# Patient Record
Sex: Male | Born: 1935 | Race: Black or African American | Hispanic: No | Smoking: Former smoker
Health system: Southern US, Community
[De-identification: ages and names within clinical notes are randomized; demographics above are authoritative.]

## PROBLEM LIST (undated history)

## (undated) DIAGNOSIS — M1990 Unspecified osteoarthritis, unspecified site: Secondary | ICD-10-CM

## (undated) HISTORY — PX: HERNIA REPAIR: SHX51

## (undated) HISTORY — DX: Unspecified osteoarthritis, unspecified site: M19.90

---

## 1998-12-29 ENCOUNTER — Encounter: Admission: RE | Admit: 1998-12-29 | Discharge: 1998-12-29 | Payer: Self-pay | Admitting: Hematology and Oncology

## 2005-05-31 ENCOUNTER — Emergency Department (HOSPITAL_COMMUNITY): Admission: EM | Admit: 2005-05-31 | Discharge: 2005-05-31 | Payer: Self-pay | Admitting: Emergency Medicine

## 2005-06-13 ENCOUNTER — Emergency Department (HOSPITAL_COMMUNITY): Admission: EM | Admit: 2005-06-13 | Discharge: 2005-06-13 | Payer: Self-pay | Admitting: Family Medicine

## 2010-09-28 ENCOUNTER — Emergency Department (HOSPITAL_COMMUNITY)
Admission: EM | Admit: 2010-09-28 | Discharge: 2010-09-29 | Payer: Self-pay | Source: Home / Self Care | Admitting: Emergency Medicine

## 2010-11-16 ENCOUNTER — Encounter (HOSPITAL_COMMUNITY): Payer: PRIVATE HEALTH INSURANCE | Attending: Urology

## 2010-11-16 DIAGNOSIS — N400 Enlarged prostate without lower urinary tract symptoms: Secondary | ICD-10-CM | POA: Insufficient documentation

## 2010-11-16 DIAGNOSIS — Z01818 Encounter for other preprocedural examination: Secondary | ICD-10-CM | POA: Insufficient documentation

## 2010-11-16 DIAGNOSIS — R339 Retention of urine, unspecified: Secondary | ICD-10-CM | POA: Insufficient documentation

## 2010-11-16 LAB — COMPREHENSIVE METABOLIC PANEL
AST: 21 U/L (ref 0–37)
CO2: 29 mEq/L (ref 19–32)
Creatinine, Ser: 0.84 mg/dL (ref 0.4–1.5)
Potassium: 4.1 mEq/L (ref 3.5–5.1)
Sodium: 142 mEq/L (ref 135–145)
Total Bilirubin: 0.9 mg/dL (ref 0.3–1.2)
Total Protein: 7.6 g/dL (ref 6.0–8.3)

## 2010-11-16 LAB — PROTIME-INR: INR: 1.07 (ref 0.00–1.49)

## 2010-11-16 LAB — URINALYSIS, ROUTINE W REFLEX MICROSCOPIC
Nitrite: POSITIVE — AB
Urobilinogen, UA: 0.2 mg/dL (ref 0.0–1.0)

## 2010-11-16 LAB — SURGICAL PCR SCREEN: MRSA, PCR: NEGATIVE

## 2010-11-16 LAB — CBC
Hemoglobin: 14.9 g/dL (ref 13.0–17.0)
MCHC: 35 g/dL (ref 30.0–36.0)
MCV: 86.6 fL (ref 78.0–100.0)
Platelets: 238 10*3/uL (ref 150–400)
RBC: 4.92 MIL/uL (ref 4.22–5.81)

## 2010-11-16 LAB — URINE MICROSCOPIC-ADD ON

## 2010-11-18 LAB — URINE CULTURE
Colony Count: >=100000
Culture  Setup Time: 201202081432

## 2010-11-25 ENCOUNTER — Inpatient Hospital Stay (HOSPITAL_COMMUNITY)
Admission: RE | Admit: 2010-11-25 | Discharge: 2010-12-01 | DRG: 707 | Disposition: A | Discharge status: Home / Self Care | Payer: Medicare (Managed Care) | Source: Ambulatory Visit | Attending: Urology | Admitting: Urology

## 2010-11-25 ENCOUNTER — Other Ambulatory Visit: Payer: Self-pay | Admitting: Urology

## 2010-11-25 DIAGNOSIS — Y838 Other surgical procedures as the cause of abnormal reaction of the patient, or of later complication, without mention of misadventure at the time of the procedure: Secondary | ICD-10-CM | POA: Diagnosis not present

## 2010-11-25 DIAGNOSIS — N138 Other obstructive and reflux uropathy: Principal | ICD-10-CM | POA: Diagnosis present

## 2010-11-25 DIAGNOSIS — J449 Chronic obstructive pulmonary disease, unspecified: Secondary | ICD-10-CM | POA: Diagnosis present

## 2010-11-25 DIAGNOSIS — R339 Retention of urine, unspecified: Secondary | ICD-10-CM | POA: Diagnosis present

## 2010-11-25 DIAGNOSIS — IMO0002 Reserved for concepts with insufficient information to code with codable children: Secondary | ICD-10-CM | POA: Diagnosis not present

## 2010-11-25 DIAGNOSIS — K644 Residual hemorrhoidal skin tags: Secondary | ICD-10-CM | POA: Diagnosis present

## 2010-11-25 DIAGNOSIS — R112 Nausea with vomiting, unspecified: Secondary | ICD-10-CM | POA: Diagnosis not present

## 2010-11-25 DIAGNOSIS — K5900 Constipation, unspecified: Secondary | ICD-10-CM | POA: Diagnosis not present

## 2010-11-25 DIAGNOSIS — J4489 Other specified chronic obstructive pulmonary disease: Secondary | ICD-10-CM | POA: Diagnosis present

## 2010-11-25 DIAGNOSIS — N3289 Other specified disorders of bladder: Secondary | ICD-10-CM | POA: Diagnosis not present

## 2010-11-25 DIAGNOSIS — N401 Enlarged prostate with lower urinary tract symptoms: Principal | ICD-10-CM | POA: Diagnosis present

## 2010-11-25 LAB — BASIC METABOLIC PANEL
BUN: 8 mg/dL (ref 6–23)
BUN: 8 mg/dL (ref 6–23)
CO2: 27 mEq/L (ref 19–32)
CO2: 29 mEq/L (ref 19–32)
Calcium: 8.2 mg/dL — ABNORMAL LOW (ref 8.4–10.5)
Calcium: 8.1 mg/dL — ABNORMAL LOW (ref 8.4–10.5)
Creatinine, Ser: 0.78 mg/dL (ref 0.4–1.5)
GFR calc Af Amer: >60 mL/min (ref >60)
GFR calc non Af Amer: >60 mL/min (ref >60)
GFR calc non Af Amer: >60 mL/min (ref >60)
Glucose, Bld: 140 mg/dL — ABNORMAL HIGH (ref 70–99)
Glucose, Bld: 165 mg/dL — ABNORMAL HIGH (ref 70–99)

## 2010-11-25 LAB — CBC
Hemoglobin: 11.4 g/dL — ABNORMAL LOW (ref 13.0–17.0)
Hemoglobin: 11.2 g/dL — ABNORMAL LOW (ref 13.0–17.0)
MCH: 30.3 pg (ref 26.0–34.0)
MCV: 87.5 fL (ref 78.0–100.0)
Platelets: 150 10*3/uL (ref 150–400)
Platelets: 152 10*3/uL (ref 150–400)
WBC: 15.4 10*3/uL — ABNORMAL HIGH (ref 4.0–10.5)

## 2010-11-26 LAB — CBC
HCT: 31.4 % — ABNORMAL LOW (ref 39.0–52.0)
MCHC: 34.4 g/dL (ref 30.0–36.0)
MCV: 86.3 fL (ref 78.0–100.0)
Platelets: 155 10*3/uL (ref 150–400)
RBC: 3.64 MIL/uL — ABNORMAL LOW (ref 4.22–5.81)
WBC: 13.5 10*3/uL — ABNORMAL HIGH (ref 4.0–10.5)

## 2010-11-26 LAB — BASIC METABOLIC PANEL
CO2: 28 mEq/L (ref 19–32)
GFR calc non Af Amer: >60 mL/min (ref >60)
Potassium: 3.9 mEq/L (ref 3.5–5.1)

## 2010-11-26 LAB — PREPARE FRESH FROZEN PLASMA: Unit division: 0

## 2010-11-27 LAB — CBC
HCT: 31.1 % — ABNORMAL LOW (ref 39.0–52.0)
Hemoglobin: 10.5 g/dL — ABNORMAL LOW (ref 13.0–17.0)
MCH: 30 pg (ref 26.0–34.0)
MCV: 88.9 fL (ref 78.0–100.0)
RBC: 3.5 MIL/uL — ABNORMAL LOW (ref 4.22–5.81)

## 2010-11-27 LAB — BASIC METABOLIC PANEL
BUN: 6 mg/dL (ref 6–23)
Chloride: 103 mEq/L (ref 96–112)
Creatinine, Ser: 1.01 mg/dL (ref 0.4–1.5)
GFR calc Af Amer: >60 mL/min (ref >60)
Sodium: 138 mEq/L (ref 135–145)

## 2010-11-28 LAB — TYPE AND SCREEN
Unit division: 0
Unit division: 0

## 2010-11-28 LAB — POCT I-STAT 4, (NA,K, GLUC, HGB,HCT): HCT: 27 % — ABNORMAL LOW (ref 39.0–52.0)

## 2010-11-29 LAB — CBC
Hemoglobin: 9.9 g/dL — ABNORMAL LOW (ref 13.0–17.0)
MCH: 29.5 pg (ref 26.0–34.0)
MCHC: 33.4 g/dL (ref 30.0–36.0)
RDW: 13.1 % (ref 11.5–15.5)

## 2010-11-29 LAB — BASIC METABOLIC PANEL
BUN: 6 mg/dL (ref 6–23)
Calcium: 8.6 mg/dL (ref 8.4–10.5)
GFR calc non Af Amer: >60 mL/min (ref >60)
Potassium: 3.9 mEq/L (ref 3.5–5.1)

## 2010-12-19 LAB — DIFFERENTIAL
Lymphocytes Relative: 4 % — ABNORMAL LOW (ref 12–46)
Lymphs Abs: 0.5 10*3/uL — ABNORMAL LOW (ref 0.7–4.0)
Monocytes Absolute: 1.3 10*3/uL — ABNORMAL HIGH (ref 0.1–1.0)
Monocytes Relative: 11 % (ref 3–12)
Neutro Abs: 10.9 10*3/uL — ABNORMAL HIGH (ref 1.7–7.7)
Neutrophils Relative %: 86 % — ABNORMAL HIGH (ref 43–77)

## 2010-12-19 LAB — GLUCOSE, CAPILLARY: Glucose-Capillary: 133 mg/dL — ABNORMAL HIGH (ref 70–99)

## 2010-12-19 LAB — COMPREHENSIVE METABOLIC PANEL
Albumin: 4.5 g/dL (ref 3.5–5.2)
BUN: 18 mg/dL (ref 6–23)
Calcium: 10 mg/dL (ref 8.4–10.5)
Creatinine, Ser: 1.27 mg/dL (ref 0.4–1.5)
Potassium: 3.8 mEq/L (ref 3.5–5.1)
Total Protein: 8.4 g/dL — ABNORMAL HIGH (ref 6.0–8.3)

## 2010-12-19 LAB — POCT CARDIAC MARKERS
CKMB, poc: <1 ng/mL — ABNORMAL LOW (ref 1.0–8.0)
Myoglobin, poc: 179 ng/mL (ref 12–200)
Troponin i, poc: <0.05 ng/mL (ref 0.00–0.09)

## 2010-12-19 LAB — URINALYSIS, ROUTINE W REFLEX MICROSCOPIC
Ketones, ur: NEGATIVE mg/dL
Protein, ur: 30 mg/dL — AB
Urobilinogen, UA: 0.2 mg/dL (ref 0.0–1.0)

## 2010-12-19 LAB — URINE MICROSCOPIC-ADD ON

## 2010-12-19 LAB — CBC
MCH: 30.7 pg (ref 26.0–34.0)
MCHC: 35.1 g/dL (ref 30.0–36.0)
MCV: 87.4 fL (ref 78.0–100.0)
Platelets: 203 10*3/uL (ref 150–400)
RDW: 13.2 % (ref 11.5–15.5)
WBC: 12.7 10*3/uL — ABNORMAL HIGH (ref 4.0–10.5)

## 2010-12-22 NOTE — Discharge Summary (Signed)
  NAME:  Cameron Barton, Cameron Barton                ACCOUNT NO.:  [id number]  MEDICAL RECORD NO.:  [id number]           PATIENT TYPE:  I  LOCATION:  1431                         FACILITY:  [facility]  PHYSICIAN:  Martina Sinner, MD DATE OF BIRTH:  1929-07-19  DATE OF ADMISSION:  11/25/2010 DATE OF DISCHARGE:  12/01/2010                              DISCHARGE SUMMARY   ADMISSION DIAGNOSIS:  Urinary retention from benign prostatic hyperplasia; status post open prostatectomy.  Cameron Barton has retention from BPH.  He underwent a suprapubic prostatectomy by myself and Dr. Vernie Ammons.  He did very well postoperatively.  He was maintained with a suprapubic catheter and Foley catheter.  He is elderly and we had added concerns about bleeding and constipation and blood per rectum while he was in the hospital.  He was observed a little bit longer since he had a few intermittent clots requiring ongoing Foley care.  He was afebrile postprocedure.  There was a lot of bleeding at the time of surgery.  He required 3 units of blood.  On February 21, his hemoglobin was 9.9, creatinine was 0.96, and electrolytes were normal.  He was afebrile throughout his course.  He was on antibiotics.  On the day of discharge, he was having irregular bowel movements.  He had been given some stool softeners as well as Dulcolax and Fleet enema. His incision looked good.  His Foley catheter was removed.  He went home with the suprapubic tube with instructions.  He went home with ciprofloxacin, stool softeners, and pain medication.  My nurse will call him tomorrow.  I will see him on Monday and I will take out the skin staples and give him a trial of voiding.  Overall, he did very well and I had done a digital rectal examination and he had external hemorrhoids that were quite impressive and no blood per rectum internally.          ______________________________ Martina Sinner, MD     SAM/MEDQ  D:  12/01/2010  T:   12/01/2010  Job:  [id number]  Electronically Signed by Alfredo Martinez MD on 12/22/2010 12:49:00 PM

## 2010-12-22 NOTE — Op Note (Signed)
NAME:  Cameron Barton, Cameron Barton                ACCOUNT NO.:  [id number]  MEDICAL RECORD NO.:  [id number]           PATIENT TYPE:  I  LOCATION:  0007                         FACILITY:  [facility]  PHYSICIAN:  Martina Sinner, MD DATE OF BIRTH:  [birth year]  DATE OF PROCEDURE:  11/25/2010 DATE OF DISCHARGE:                              OPERATIVE REPORT   PREOPERATIVE DIAGNOSIS:  Benign prostatic hyperplasia with retention.  POSTOPERATIVE DIAGNOSIS:  Benign prostatic hyperplasia with retention.  SURGERY:  Suprapubic prostatectomy, plus insertion of suprapubic catheter.  SURGEON:  Martina Sinner, MD.  ASSISTANTLoraine Leriche C. Vernie Ammons, M.D.  DESCRIPTION OF PROCEDURE:  Cameron Barton has a 200 g prostate.  He is in retention.  His urine was sterile prior to surgery and his lab work was normal.  He was placed in the supine position and in mild lithotomy.  A low midline suprapubic incision was made extending just below the umbilicus to the palpable suprapubic bone.  After prepping the patient, a sterile 24-French two-way catheter was inserted in the bladder.  I dissected down to the soft tissue and split the rectus muscle in the midline.  We entered the retropubic space.  Stay sutures were placed on the thickened bladder which was opened for approximately 8 to 10 cm.  I extended the cystotomy to approximately 3 cm from the bladder neck.  A Bookwalter retractor was utilized.  Middle blade was used in the bladder with 3 to 4 moist Ray-Tec stick to put the bladder on stretch.  Stay sutures were utilized as well.  There was excellent exposure of the large adenoma with minimal intravesical component.  With cautery, I outlined a circle around the prostatic urethra and bladder neck.  I placed my finger in the prostatic urethra placing it anteriorly, splitting the adenoma in the midline to the prostatic capsule and it took approximately 10 minutes to finger dissect and mobilize the large prostatic adenoma from  the thickened prostatic capsule.  It was difficult because it was so deep to get to the apex.  The adenoma was removed in its entirety.  Blood loss was reasonable that part of the dissection.  The adenoma was removed completely.  At this stage, we packed the prostatic fossa with moist packs for a few minutes.  With excellent exposure, we removed the packs and started oversewing bleeders first at the 5 and 7 o'clock incorporating the bladder mucosa into our closure.  I used SH needle, CT-1 needle and larger rounder needles with multiple suture ligatures to control the bleeding.  For approximately an hour we continued to tie bleeders primarily at 5 and 7 o'clock, at the bladder neck in its posterior half especially near the 5 and 7 o'clock and also apically which was more difficult.  We used a Foley catheter to make certain that we did not tie off the urethra and we were working apically.  There was no perforation of the thick prostatic capsule.  We tried to use the Foley balloon, blown up a few times in the prostatic fossa to stop bleeding but it really did not make a lot of  difference.  In a very controlled way, we continued to work until finally the bleeding was very minimal.  There were excellent blue jets from the ureteral orifices at all times.  There was no injury to the bladder.  Recognizing we could be partially occluding the ureters, we kept blown up Foley balloon with 50 cc in it on the bladder neck for probably 40 minutes while we finished the case.  We closed the bladder with running 2-0 Vicryl, 2 layers.  It was watertight.  Using an Allis and a small scalpel incision and a Kelly clamp, we delivered a 22-French two-way Foley catheter through the lower left abdomen causing no bleeding.  With the usual technique, it was placed into the bladder through a separate stab incision of the suprapubic tube.  A 2-0 chromic pursestring suture was utilized.  On the opposite side  using the same technique, we brought in a large round Jackson-Pratt drain.  The 2-0 silks were used for both the drain and suprapubic tube, 10 cc was in the suprapubic balloon, 50 cc was in the urethral catheter. Copious irrigation was used.  I closed the fascia with running #1 PDS on a large needle.  Skin staples were used for the skin.  Pressure dressing was applied.  Without the patient on traction, the catheter irrigated very nicely and suprapubic through the urethral catheter.  There was mild bleeding.  I set up slow flow CBI going through the suprapubic and exiting out the urethral Foley.  Velcro leg strap was applied.  A 3 units of blood and 2 units of fresh frozen plasma was given to the patient.  After 2 units of blood, the patient's hemoglobin, I believe, was 9.7.  Hemodynamically he did beautifully.  The patient will be continued to be monitored postoperatively. Hopefully, he will reach his treatment goal.          ______________________________ Martina Sinner, MD     SAM/MEDQ  D:  11/25/2010  T:  11/25/2010  Job:  [id number]  Electronically Signed by Alfredo Martinez MD on 12/22/2010 12:49:03 PM

## 2010-12-22 NOTE — H&P (Signed)
  NAME:  Cameron Barton, Cameron Barton                ACCOUNT NO.:  [id number]  MEDICAL RECORD NO.:  [id number]           PATIENT TYPE:  I  LOCATION:  1431                         FACILITY:  [facility]  PHYSICIAN:  Martina Sinner, MD DATE OF BIRTH:  16-Dec-1928  DATE OF ADMISSION:  11/25/2010 DATE OF DISCHARGE:                             HISTORY & PHYSICAL   ADMISSION DIAGNOSIS:  Retention with benign prostatic hyperplasia.  HISTORY:  Mr. Skolnick has retention and has a 200-g prostate.  He had an open prostatectomy today was admitted from usual postoperative care.  PAST HEALTH:  Arthritis, hernia repair, kidney surgery.  MEDICATIONS: 1. Aleve. 2. Vicodin.  ALLERGIES:  None.  FAMILY HISTORY:  No history of GU disease.  SOCIAL HISTORY:  Works Designer, multimedia.  REVIEW OF SYSTEMS:  Negative.  PHYSICAL EXAMINATION:  VITAL SIGNS:  Normal.  No distress. CARDIOVASCULAR:  Skin warm. RESPIRATORY:  Breaths quiet. ABDOMEN:  No palpable bladder. GU EXAM:  Foley catheter. GENITALIA:  Normal. MUSCULOSKELETAL:  Normal arm motor strength. SKIN:  No rashes. NEURO:  Normal sensation to touch.  IMPRESSION:  Retention from benign prostatic hyperplasia.  PLAN:  Admit post open prostatectomy.          ______________________________ Martina Sinner, MD     SAM/MEDQ  D:  11/25/2010  T:  11/26/2010  Job:  [id number]  Electronically Signed by Alfredo Martinez MD on 12/22/2010 12:49:04 PM

## 2015-08-01 ENCOUNTER — Ambulatory Visit (INDEPENDENT_AMBULATORY_CARE_PROVIDER_SITE_OTHER): Payer: Commercial Managed Care - HMO | Admitting: Physician Assistant

## 2015-08-01 VITALS — BP 142/78 | HR 84 | Temp 98.5°F | Resp 18 | Ht 68.0 in | Wt 207.0 lb

## 2015-08-01 DIAGNOSIS — H6123 Impacted cerumen, bilateral: Secondary | ICD-10-CM

## 2015-08-01 NOTE — Progress Notes (Signed)
   Subjective:    Patient ID: Cameron Barton, male    DOB: Aug 11, 1929, 81 y.o.   MRN: [id number]  HPI Patient presents to have ears cleaned out bc he is not hearing as well as usual. Denies tinnitus, HA, imbalance. Notes that he has been more congested lately, but denies cough, fever, or rhinorrhea. NKDA.   Review of Systems As noted above.    Objective:   Physical Exam  Constitutional: He is oriented to person, place, and time. He appears well-developed and well-nourished. No distress.  Blood pressure 142/78, pulse 84, temperature 98.5 F (36.9 C), resp. rate 18, height 5\' 8"  (1.727 m), weight 207 lb (93.895 kg), SpO2 98 %.   HENT:  Head: Normocephalic and atraumatic.  Right Ear: External ear normal. No drainage, swelling or tenderness. A middle ear effusion (cerumen impaction) is present.  Left Ear: External ear normal. No drainage, swelling or tenderness. A middle ear effusion (cerumen impaction) is present.  Nose: Nose normal.  Mouth/Throat: Uvula is midline and oropharynx is clear and moist. No oropharyngeal exudate.  Eyes: Conjunctivae are normal. Right eye exhibits no discharge. Left eye exhibits no discharge. No scleral icterus.  Pulmonary/Chest: Effort normal.  Neurological: He is alert and oriented to person, place, and time.  Skin: He is not diaphoretic.  Psychiatric: He has a normal mood and affect. His behavior is normal. Judgment and thought content normal.       Assessment & Plan:  1. Cerumen impaction, bilateral Irrigated. Resolved.   Janan Ridgeishira Suzette Flagler PA-C  [facility] 08/01/2015 11:05 AM

## 2015-08-01 NOTE — Patient Instructions (Signed)
Cerumen Impaction The structures of the external ear canal secrete a waxy substance known as cerumen. Excess cerumen can build up in the ear canal, causing a condition known as cerumen impaction. Cerumen impaction can cause ear pain and disrupt the function of the ear. The rate of cerumen production differs for each individual. In certain individuals, the configuration of the ear canal may decrease his or her ability to naturally remove cerumen. CAUSES Cerumen impaction is caused by excessive cerumen production or buildup. RISK FACTORS  Frequent use of swabs to clean ears.  Having narrow ear canals.  Having eczema.  Being dehydrated. SIGNS AND SYMPTOMS  Diminished hearing.  Ear drainage.  Ear pain.  Ear itch. TREATMENT Treatment may involve:  Over-the-counter or prescription ear drops to soften the cerumen.  Removal of cerumen by a health care provider. This may be done with:  Irrigation with warm water. This is the most common method of removal.  Ear curettes and other instruments.  Surgery. This may be done in severe cases. HOME CARE INSTRUCTIONS  Take medicines only as directed by your health care provider.  Do not insert objects into the ear with the intent of cleaning the ear. PREVENTION  Do not insert objects into the ear, even with the intent of cleaning the ear. Removing cerumen as a part of normal hygiene is not necessary, and the use of swabs in the ear canal is not recommended.  Drink enough water to keep your urine clear or pale yellow.  Control your eczema if you have it. SEEK MEDICAL CARE IF:  You develop ear pain.  You develop bleeding from the ear.  The cerumen does not clear after you use ear drops as directed.   This information is not intended to replace advice given to you by your health care provider. Make sure you discuss any questions you have with your health care provider.   Document Released: 11/02/2004 Document Revised: 10/16/2014  Document Reviewed: 05/12/2015 Elsevier Interactive Patient Education 2016 Elsevier Inc.  

## 2016-01-18 ENCOUNTER — Ambulatory Visit: Payer: PRIVATE HEALTH INSURANCE | Admitting: Family Medicine

## 2016-11-25 ENCOUNTER — Encounter (HOSPITAL_COMMUNITY): Payer: Self-pay | Admitting: Emergency Medicine

## 2016-11-25 ENCOUNTER — Ambulatory Visit (HOSPITAL_COMMUNITY)
Admission: EM | Admit: 2016-11-25 | Discharge: 2016-11-25 | Disposition: A | Discharge status: Home / Self Care | Payer: Medicare HMO | Attending: Family Medicine | Admitting: Family Medicine

## 2016-11-25 DIAGNOSIS — H6980 Other specified disorders of Eustachian tube, unspecified ear: Secondary | ICD-10-CM | POA: Diagnosis not present

## 2016-11-25 DIAGNOSIS — G44209 Tension-type headache, unspecified, not intractable: Secondary | ICD-10-CM | POA: Diagnosis not present

## 2016-11-25 MED ORDER — IPRATROPIUM BROMIDE 0.06 % NA SOLN: 2.0000 | Freq: Four times a day (QID) | NASAL | 0 refills | Status: DC

## 2016-11-25 MED ORDER — NAPROXEN 250 MG PO TABS: 250.0000 mg | ORAL_TABLET | Freq: Two times a day (BID) | ORAL | 0 refills | Status: DC

## 2016-11-25 NOTE — ED Triage Notes (Signed)
PT reports bilateral ear fullness and a prickling headache for 8 days

## 2016-11-25 NOTE — ED Provider Notes (Signed)
CSN: [id number]     Arrival date & time 11/25/16  1204 History   First MD Initiated Contact with Patient 11/25/16 1243     Chief Complaint  Patient presents with  . Headache  . Ear Fullness   (Consider location/radiation/quality/duration/timing/severity/associated sxs/prior Treatment) Patient c/o ear fullness and headache for 8 days.   The history is provided by the patient.  Headache  Associated symptoms: ear pain   Ear Fullness  Associated symptoms include headaches.    Past Medical History:  Diagnosis Date  . Arthritis    Past Surgical History:  Procedure Laterality Date  . HERNIA REPAIR     No family history on file. Social History  Substance Use Topics  . Smoking status: Former Games developermoker  . Smokeless tobacco: Never Used  . Alcohol use No    Review of Systems  Constitutional: Negative.   HENT: Positive for ear pain.   Eyes: Negative.   Respiratory: Negative.   Cardiovascular: Negative.   Gastrointestinal: Negative.   Endocrine: Negative.   Genitourinary: Negative.   Musculoskeletal: Negative.   Allergic/Immunologic: Negative.   Neurological: Positive for headaches.  Hematological: Negative.   Psychiatric/Behavioral: Negative.     Allergies  Patient has no known allergies.  Home Medications   Prior to Admission medications   Medication Sig Start Date End Date Taking? Authorizing Provider  ipratropium (ATROVENT) 0.06 % nasal spray Place 2 sprays into both nostrils 4 (four) times daily. 11/25/16   Deatra CanterWilliam J Cadon Raczka, FNP  naproxen (NAPROSYN) 250 MG tablet Take 1 tablet (250 mg total) by mouth 2 (two) times daily with a meal. 11/25/16   Deatra CanterWilliam J Teo Moede, FNP   Meds Ordered and Administered this Visit  Medications - No data to display  BP 133/55   Pulse 86   Temp 98.4 F (36.9 C) (Oral)   Resp 16   Ht 5\' 8"  (1.727 m)   Wt 215 lb (97.5 kg)   SpO2 100%   BMI 32.69 kg/m  No data found.   Physical Exam  Constitutional: He appears well-developed and  well-nourished.  HENT:  Head: Normocephalic and atraumatic.  Right Ear: External ear normal.  Left Ear: External ear normal.  Mouth/Throat: Oropharynx is clear and moist.  Eyes: Conjunctivae and EOM are normal. Pupils are equal, round, and reactive to light.  Neck: Normal range of motion. Neck supple.  Cardiovascular: Normal rate, regular rhythm and normal heart sounds.   Pulmonary/Chest: Effort normal and breath sounds normal.  Nursing note and vitals reviewed.   Urgent Care Course     Procedures (including critical care time)  Labs Review Labs Reviewed - No data to display  Imaging Review No results found.   Visual Acuity Review  Right Eye Distance:   Left Eye Distance:   Bilateral Distance:    Right Eye Near:   Left Eye Near:    Bilateral Near:         MDM   1. Tension headache   2. Dysfunction of Eustachian tube, unspecified laterality    Atrovent nasal spray 2 sprays both nostrils qid Naprosyn 250 mg one po bid x 7 days #14    Deatra CanterWilliam J Kolette Vey, FNP 11/25/16 1306

## 2016-12-11 DIAGNOSIS — H04123 Dry eye syndrome of bilateral lacrimal glands: Secondary | ICD-10-CM | POA: Diagnosis not present

## 2016-12-11 DIAGNOSIS — H2511 Age-related nuclear cataract, right eye: Secondary | ICD-10-CM | POA: Diagnosis not present

## 2017-01-03 ENCOUNTER — Emergency Department (HOSPITAL_COMMUNITY): Payer: Medicare HMO

## 2017-01-03 ENCOUNTER — Emergency Department (HOSPITAL_COMMUNITY)
Admission: EM | Admit: 2017-01-03 | Discharge: 2017-01-03 | Disposition: A | Discharge status: Home / Self Care | Payer: Medicare HMO | Attending: Physician Assistant | Admitting: Physician Assistant

## 2017-01-03 ENCOUNTER — Encounter (HOSPITAL_COMMUNITY): Payer: Self-pay | Admitting: Emergency Medicine

## 2017-01-03 DIAGNOSIS — Z87891 Personal history of nicotine dependence: Secondary | ICD-10-CM | POA: Diagnosis not present

## 2017-01-03 DIAGNOSIS — H9313 Tinnitus, bilateral: Secondary | ICD-10-CM | POA: Insufficient documentation

## 2017-01-03 DIAGNOSIS — R51 Headache: Secondary | ICD-10-CM | POA: Diagnosis not present

## 2017-01-03 DIAGNOSIS — Z79899 Other long term (current) drug therapy: Secondary | ICD-10-CM | POA: Diagnosis not present

## 2017-01-03 LAB — CBC WITH DIFFERENTIAL/PLATELET
BASOS PCT: 0 %
Basophils Absolute: 0 10*3/uL (ref 0.0–0.1)
EOS ABS: 0 10*3/uL (ref 0.0–0.7)
EOS PCT: 1 %
HCT: 43.2 % (ref 39.0–52.0)
HEMOGLOBIN: 14.8 g/dL (ref 13.0–17.0)
Lymphocytes Relative: 34 %
Lymphs Abs: 2.1 10*3/uL (ref 0.7–4.0)
MCH: 28.7 pg (ref 26.0–34.0)
MCHC: 34.3 g/dL (ref 30.0–36.0)
MCV: 83.9 fL (ref 78.0–100.0)
Monocytes Absolute: 0.8 10*3/uL (ref 0.1–1.0)
Monocytes Relative: 12 %
NEUTROS PCT: 53 %
Neutro Abs: 3.2 10*3/uL (ref 1.7–7.7)
PLATELETS: 245 10*3/uL (ref 150–400)
RBC: 5.15 MIL/uL (ref 4.22–5.81)
RDW: 13.6 % (ref 11.5–15.5)
WBC: 6 10*3/uL (ref 4.0–10.5)

## 2017-01-03 LAB — COMPREHENSIVE METABOLIC PANEL
ALBUMIN: 4.1 g/dL (ref 3.5–5.0)
ALK PHOS: 76 U/L (ref 38–126)
ALT: 17 U/L (ref 17–63)
ANION GAP: 11 (ref 5–15)
AST: 19 U/L (ref 15–41)
BUN: 11 mg/dL (ref 6–20)
CALCIUM: 9.8 mg/dL (ref 8.9–10.3)
CHLORIDE: 105 mmol/L (ref 101–111)
CO2: 24 mmol/L (ref 22–32)
Creatinine, Ser: 0.62 mg/dL (ref 0.61–1.24)
GFR calc non Af Amer: >60 mL/min (ref >60)
GLUCOSE: 94 mg/dL (ref 65–99)
Potassium: 3.9 mmol/L (ref 3.5–5.1)
SODIUM: 140 mmol/L (ref 135–145)
Total Bilirubin: 1.1 mg/dL (ref 0.3–1.2)
Total Protein: 7.3 g/dL (ref 6.5–8.1)

## 2017-01-03 LAB — SALICYLATE LEVEL: Salicylate Lvl: <7 mg/dL (ref 2.8–30.0)

## 2017-01-03 NOTE — ED Provider Notes (Signed)
[facility]-EMERGENCY DEPT Provider Note   CSN: [id number] Arrival date & time: 01/03/17  1132     History   Chief Complaint Chief Complaint  Patient presents with  . Tinnitus    HPI Cameron Barton is a 83 y.o. male.  HPI   This 83 year old male presenting with tinnitus. Patient presents had A buzzing in his ears the last several years. He reports this been on and off. He reports that it was present a couple years ago and he doid an ear washout and been better. However the last year and a half been intermittent. His wife finally convinced him to come. He's had no other neurologic symptoms. Patient denies aspirin  Past Medical History:  Diagnosis Date  . Arthritis     There are no active problems to display for this patient.   Past Surgical History:  Procedure Laterality Date  . HERNIA REPAIR         Home Medications    Prior to Admission medications   Medication Sig Start Date End Date Taking? Authorizing Provider  ipratropium (ATROVENT) 0.06 % nasal spray Place 2 sprays into both nostrils 4 (four) times daily. 11/25/16   Deatra Canter, FNP  naproxen (NAPROSYN) 250 MG tablet Take 1 tablet (250 mg total) by mouth 2 (two) times daily with a meal. 11/25/16   Deatra Canter, FNP    Family History History reviewed. No pertinent family history.  Social History Social History  Substance Use Topics  . Smoking status: Former Games developer  . Smokeless tobacco: Never Used  . Alcohol use No     Allergies   Patient has no known allergies.   Review of Systems Review of Systems  Constitutional: Negative for activity change.  HENT: Positive for hearing loss. Negative for drooling and facial swelling.   Respiratory: Negative for shortness of breath.   Cardiovascular: Negative for chest pain.  Gastrointestinal: Negative for abdominal pain.  All other systems reviewed and are negative.    Physical Exam Updated Vital Signs BP 131/60 (BP Location: Left Arm)   Pulse 83    Temp 98.7 F (37.1 C) (Oral)   Resp 16   Ht 5\' 8"  (1.727 m)   Wt 215 lb (97.5 kg)   SpO2 98%   BMI 32.69 kg/m   Physical Exam  Constitutional: He is oriented to person, place, and time. He appears well-nourished.  HENT:  Head: Normocephalic and atraumatic.  Right Ear: External ear normal.  Left Ear: External ear normal.  Nose: Nose normal.  Mouth/Throat: No oropharyngeal exudate.  Bilateral TMs normal.  Eyes: Conjunctivae and EOM are normal. Pupils are equal, round, and reactive to light. Right eye exhibits no discharge. Left eye exhibits no discharge.  Cardiovascular: Normal rate.   Pulmonary/Chest: Effort normal and breath sounds normal. No respiratory distress.  Neurological: He is oriented to person, place, and time. No cranial nerve deficit.  Skin: Skin is warm and dry. He is not diaphoretic.  Psychiatric: He has a normal mood and affect. His behavior is normal.     ED Treatments / Results  Labs (all labs ordered are listed, but only abnormal results are displayed) Labs Reviewed  CBC WITH DIFFERENTIAL/PLATELET  COMPREHENSIVE METABOLIC PANEL  SALICYLATE LEVEL    EKG  EKG Interpretation None       Radiology No results found.  Procedures Procedures (including critical care time)  Medications Ordered in ED Medications - No data to display   Initial Impression / Assessment and Plan /  ED Course  I have reviewed the triage vital signs and the nursing notes.  Pertinent labs & imaging results that were available during my care of the patient were reviewed by me and considered in my medical decision making (see chart for details).    Patient is a well-appearing 83 year old male presenting with buzzing in his ears. He reports this been going on and off for the last several years. He has no dizziness. No peripheral vertigo. No hearing loss. This is likely chronic tinnitus. However patient's never head imaging.. Additionally we'll do labs and salicylate level to  make sure the patient does not have electrolyte disturbance.Will refer to ENT for further specialized care.  Do not think this represents acute pathology requiring intervention at this time.   Patient is comfortable, ambulatory, and taking PO at time of discharge.  Patient expressed understanding about return precautions.    Final Clinical Impressions(s) / ED Diagnoses   Final diagnoses:  None    New Prescriptions New Prescriptions   No medications on file     Myeshia Fojtik Randall AnLyn Sina Sumpter, MD 01/06/17 [phone]

## 2017-01-03 NOTE — ED Triage Notes (Signed)
Pt sts ringing in right ear and head x 2 months with some HA at night

## 2017-01-03 NOTE — Discharge Instructions (Signed)
Your head CT and yourlabs look normal. Please follow-up with ENT for the ringing in your ears.

## 2017-01-03 NOTE — ED Notes (Signed)
Pt and wife given a bag meal.

## 2017-01-29 DIAGNOSIS — H9313 Tinnitus, bilateral: Secondary | ICD-10-CM | POA: Diagnosis not present

## 2017-01-29 DIAGNOSIS — H903 Sensorineural hearing loss, bilateral: Secondary | ICD-10-CM | POA: Diagnosis not present

## 2017-01-29 DIAGNOSIS — H9113 Presbycusis, bilateral: Secondary | ICD-10-CM | POA: Diagnosis not present

## 2017-04-11 IMAGING — CT CT HEAD W/O CM
3 series · 15 of 47 positions shown, 18 images · non-contrast
Comparison: None

CLINICAL DATA: Intermittent ringing in the ears and headache for 4
months, fall, former smoker

EXAM:
CT HEAD WITHOUT CONTRAST
TECHNIQUE: Contiguous axial images were obtained from the base of the skull
through the vertex without intravenous contrast.

[Series 3: head 5.0 h30s · axial · 0.45mm/px · z∈[-171,-46]mm · 9 of 31 slices shown, 12 images]
[im 3/31  brain]
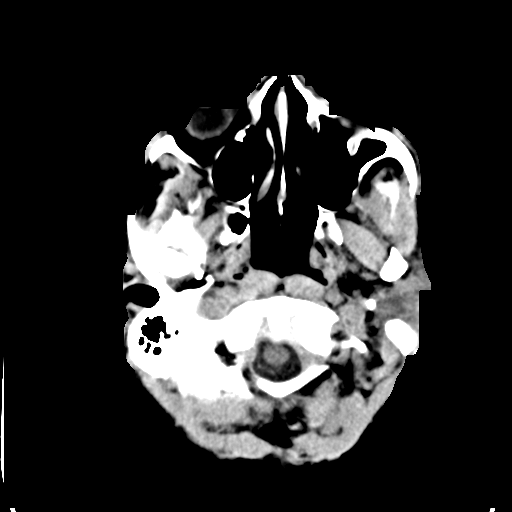
[im 3/31  bone]
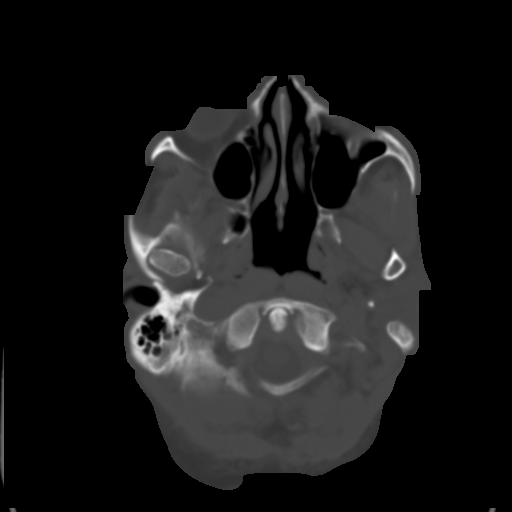
[im 6/31  brain]
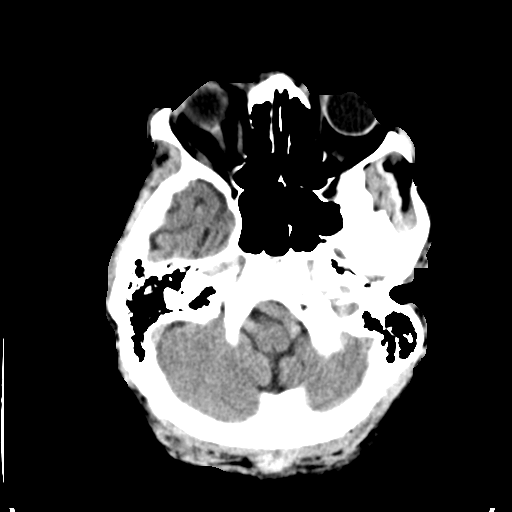
[im 9/31  brain]
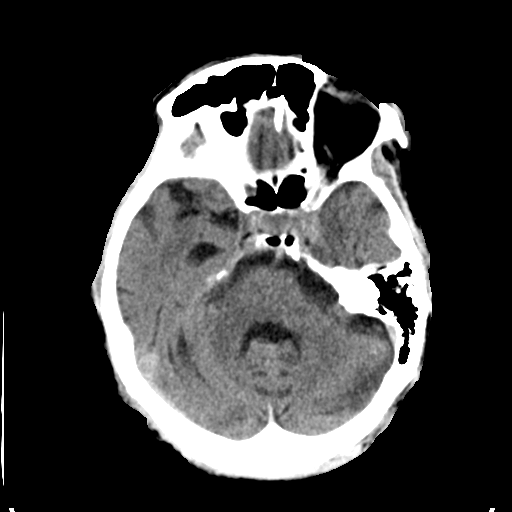
[im 12/31  brain]
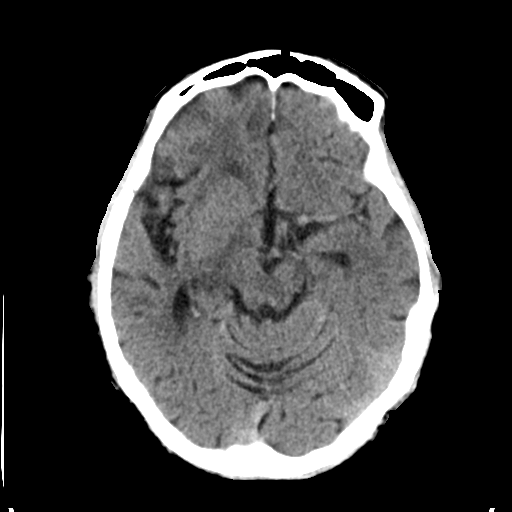
[im 16/31  brain]
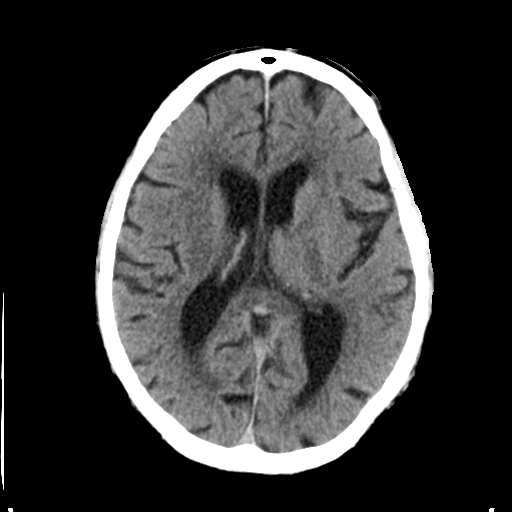
[im 16/31  bone]
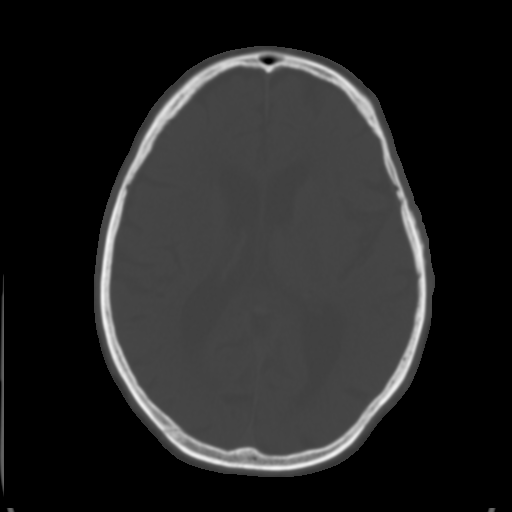
[im 19/31  brain]
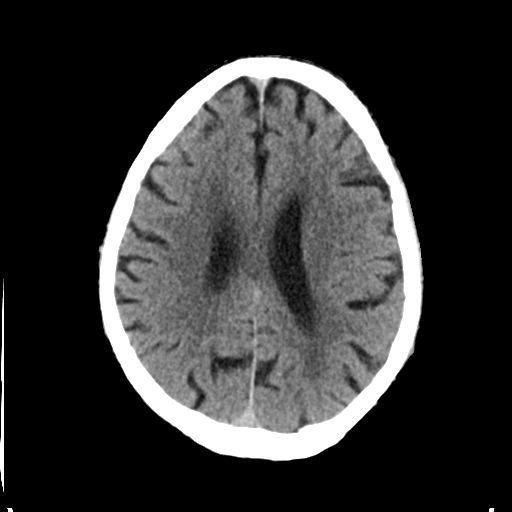
[im 22/31  brain]
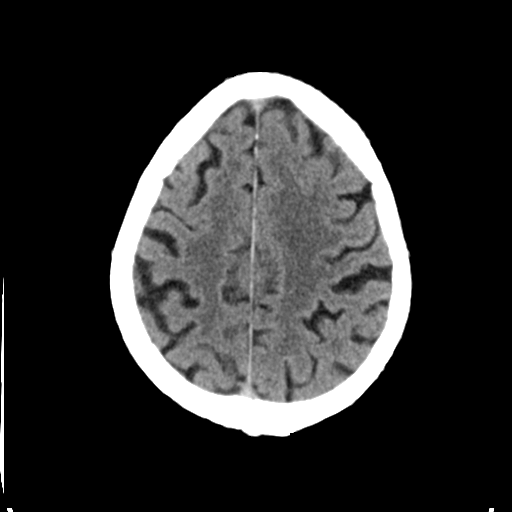
[im 25/31  brain]
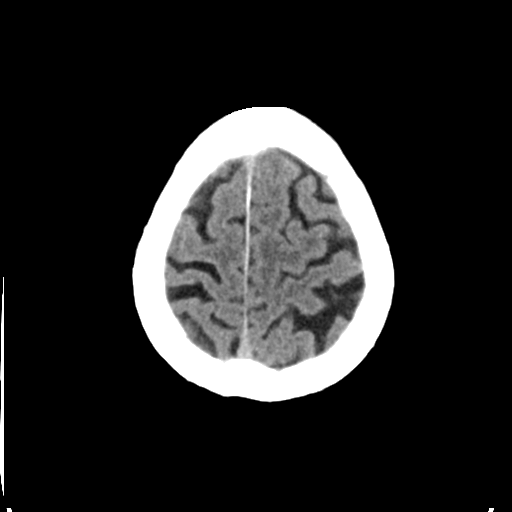
[im 28/31  brain]
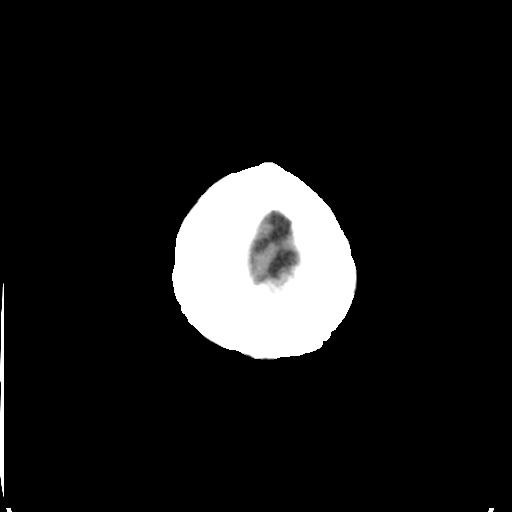
[im 28/31  bone]
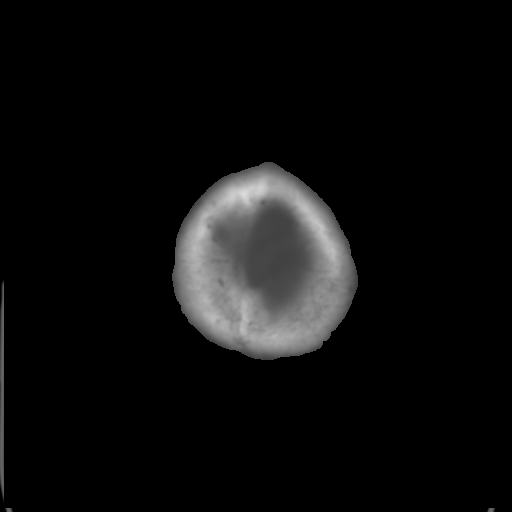

[Series 5: head 3.0 mpr cor · coronal · 0.29mm/px · 3 of 69 slices shown]
[im 23/69  brain]
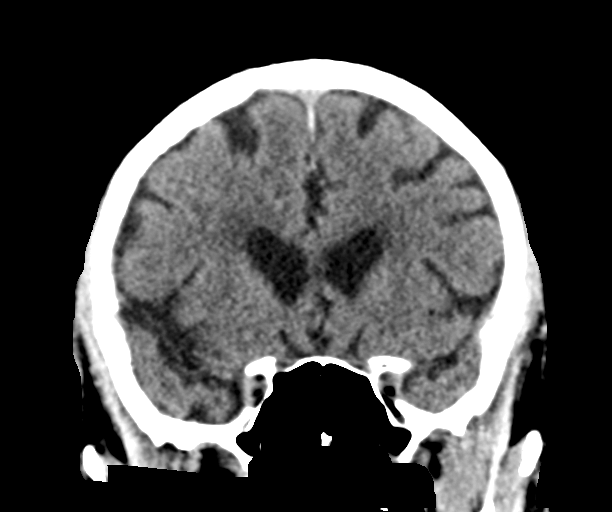
[im 31/69  brain]
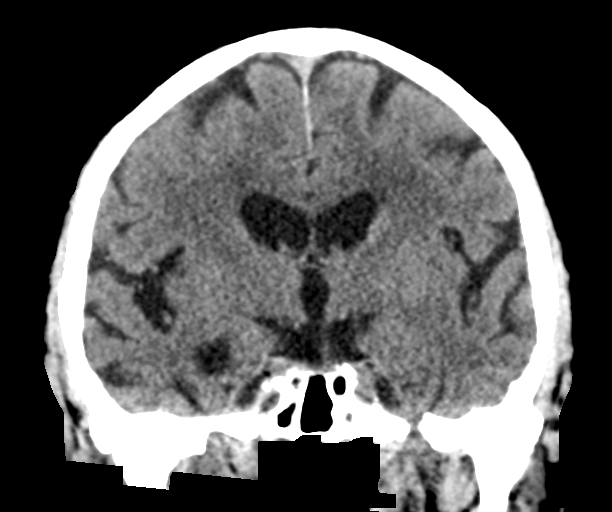
[im 38/69  brain]
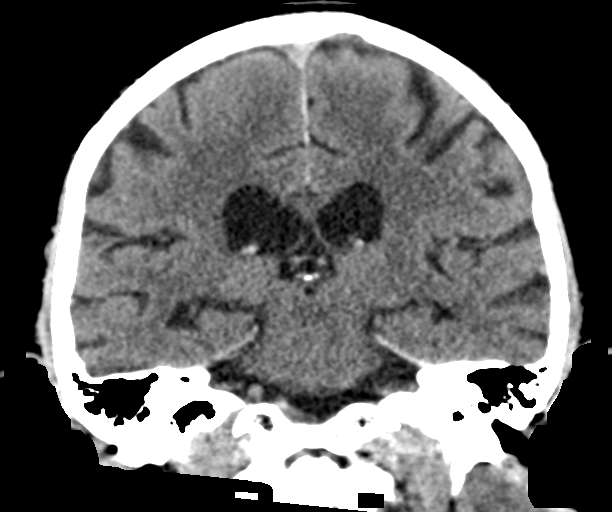

[Series 6: head 3.0 mpr sag · sagittal · 0.29mm/px · 3 of 56 slices shown]
[im 22/56  brain]
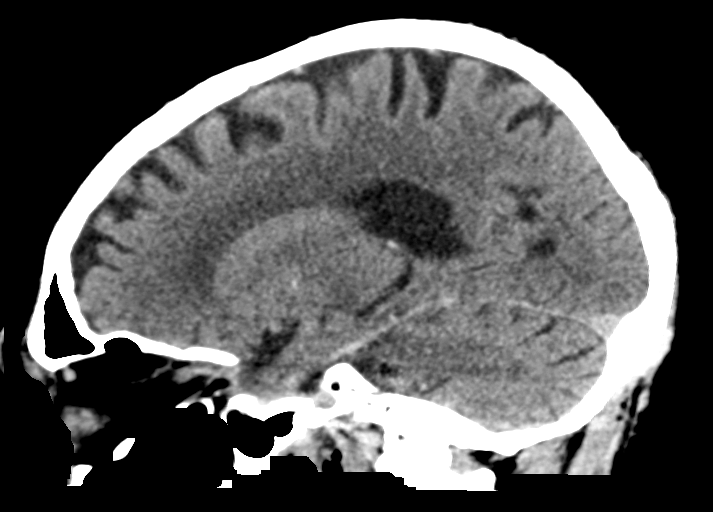
[im 28/56  brain]
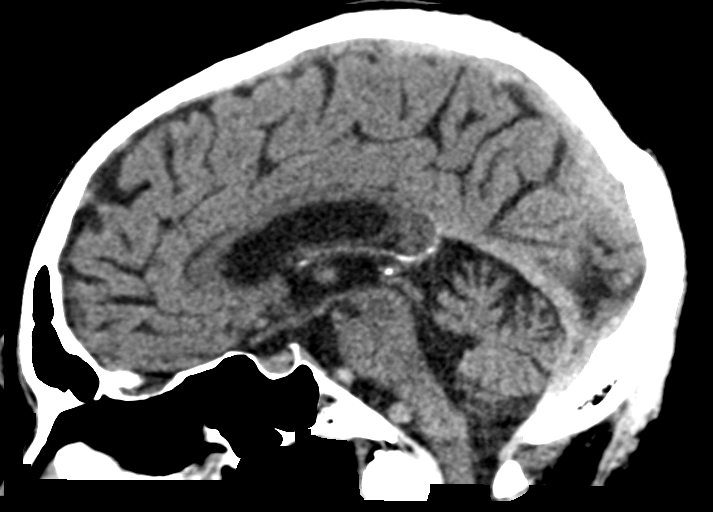
[im 34/56  brain]
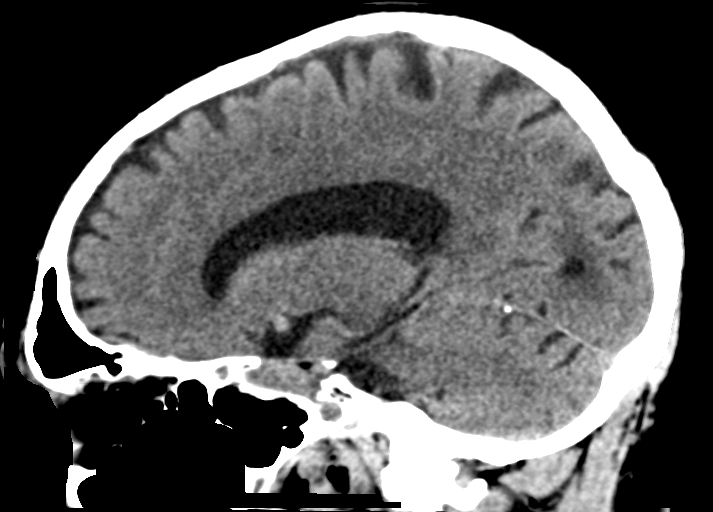

[15 of 47 positions shown; findings below may reference images not displayed]

FINDINGS: Brain: Generalized atrophy. Normal ventricular morphology. No
midline shift or mass effect. Small vessel chronic ischemic changes
of deep cerebral white matter. No intracranial hemorrhage, mass
lesion, evidence of acute infarction, or extra-axial fluid
collection.

Vascular: Unremarkable

Skull: Intact

Sinuses/Orbits: Clear

Other: N/A
IMPRESSION: Atrophy with small vessel chronic ischemic changes of deep cerebral
white matter.

No acute intracranial abnormalities.

## 2017-10-03 DIAGNOSIS — H2513 Age-related nuclear cataract, bilateral: Secondary | ICD-10-CM | POA: Diagnosis not present

## 2017-10-05 DIAGNOSIS — H43393 Other vitreous opacities, bilateral: Secondary | ICD-10-CM | POA: Diagnosis not present

## 2017-10-05 DIAGNOSIS — H40033 Anatomical narrow angle, bilateral: Secondary | ICD-10-CM | POA: Diagnosis not present

## 2017-11-12 DIAGNOSIS — H401131 Primary open-angle glaucoma, bilateral, mild stage: Secondary | ICD-10-CM | POA: Diagnosis not present

## 2017-11-12 DIAGNOSIS — H2511 Age-related nuclear cataract, right eye: Secondary | ICD-10-CM | POA: Diagnosis not present

## 2017-11-12 DIAGNOSIS — H25041 Posterior subcapsular polar age-related cataract, right eye: Secondary | ICD-10-CM | POA: Diagnosis not present

## 2017-11-30 ENCOUNTER — Emergency Department (HOSPITAL_COMMUNITY): Payer: Medicare HMO

## 2017-11-30 ENCOUNTER — Emergency Department (HOSPITAL_COMMUNITY)
Admission: EM | Admit: 2017-11-30 | Discharge: 2017-11-30 | Disposition: A | Discharge status: Home / Self Care | Payer: Medicare HMO | Attending: Emergency Medicine | Admitting: Emergency Medicine

## 2017-11-30 ENCOUNTER — Other Ambulatory Visit: Payer: Self-pay

## 2017-11-30 ENCOUNTER — Encounter (HOSPITAL_COMMUNITY): Payer: Self-pay | Admitting: *Deleted

## 2017-11-30 DIAGNOSIS — R519 Headache, unspecified: Secondary | ICD-10-CM

## 2017-11-30 DIAGNOSIS — H539 Unspecified visual disturbance: Secondary | ICD-10-CM | POA: Diagnosis not present

## 2017-11-30 DIAGNOSIS — Z87891 Personal history of nicotine dependence: Secondary | ICD-10-CM | POA: Insufficient documentation

## 2017-11-30 DIAGNOSIS — R51 Headache: Secondary | ICD-10-CM | POA: Insufficient documentation

## 2017-11-30 LAB — COMPREHENSIVE METABOLIC PANEL
ALBUMIN: 4.2 g/dL (ref 3.5–5.0)
ALK PHOS: 70 U/L (ref 38–126)
ALT: 15 U/L — ABNORMAL LOW (ref 17–63)
ANION GAP: 11 (ref 5–15)
AST: 25 U/L (ref 15–41)
BILIRUBIN TOTAL: 0.6 mg/dL (ref 0.3–1.2)
BUN: 12 mg/dL (ref 6–20)
CALCIUM: 9.5 mg/dL (ref 8.9–10.3)
CO2: 24 mmol/L (ref 22–32)
Chloride: 104 mmol/L (ref 101–111)
Creatinine, Ser: 1.01 mg/dL (ref 0.61–1.24)
GFR calc Af Amer: >60 mL/min (ref >60)
GFR calc non Af Amer: >60 mL/min (ref >60)
GLUCOSE: 103 mg/dL — AB (ref 65–99)
POTASSIUM: 3.9 mmol/L (ref 3.5–5.1)
SODIUM: 139 mmol/L (ref 135–145)
Total Protein: 7.1 g/dL (ref 6.5–8.1)

## 2017-11-30 LAB — CBC WITH DIFFERENTIAL/PLATELET
Basophils Absolute: 0 10*3/uL (ref 0.0–0.1)
Basophils Relative: 0 %
EOS PCT: 1 %
Eosinophils Absolute: 0 10*3/uL (ref 0.0–0.7)
HEMATOCRIT: 45.9 % (ref 39.0–52.0)
Hemoglobin: 15.8 g/dL (ref 13.0–17.0)
LYMPHS PCT: 36 %
Lymphs Abs: 2.2 10*3/uL (ref 0.7–4.0)
MCH: 30.2 pg (ref 26.0–34.0)
MCHC: 34.4 g/dL (ref 30.0–36.0)
MCV: 87.6 fL (ref 78.0–100.0)
MONO ABS: 0.5 10*3/uL (ref 0.1–1.0)
MONOS PCT: 8 %
NEUTROS ABS: 3.4 10*3/uL (ref 1.7–7.7)
Neutrophils Relative %: 55 %
PLATELETS: 216 10*3/uL (ref 150–400)
RBC: 5.24 MIL/uL (ref 4.22–5.81)
RDW: 13.5 % (ref 11.5–15.5)
WBC: 6.1 10*3/uL (ref 4.0–10.5)

## 2017-11-30 LAB — CBG MONITORING, ED: Glucose-Capillary: 103 mg/dL — ABNORMAL HIGH (ref 65–99)

## 2017-11-30 NOTE — ED Notes (Signed)
Pt verbalized understanding of d/c instructions and has no further questions. VSS, NAD.  

## 2017-11-30 NOTE — Discharge Instructions (Signed)
Blood sugar today was normal.  Call the number on these instruction or contact the primary care physician of your choice to get a primary care physician who can follow you on a regular basis.  Return if your condition worsens for any reason

## 2017-11-30 NOTE — ED Provider Notes (Signed)
[facility] EMERGENCY DEPARTMENT Provider Note   CSN: [id number] Arrival date & time: 11/30/17  1421     History   Chief Complaint Chief Complaint  Patient presents with  . Headache    HPI Cameron Barton is a 84 y.o. male.  Patient presents today as he had a bad diffuse headache 3 days ago.  Headache is since resolved.  He comes today as a church member told him that he should get his blood sugar checked.  His sister took him to a clinic where she goes regularly in the clinic was closed so he came here.  He is presently asymptomatic.  He does report occasional blurred vision but presently vision is normal and no other associated symptoms.  Denies polyuria or polydipsia.  HPI  Past Medical History:  Diagnosis Date  . Arthritis     There are no active problems to display for this patient.   Past Surgical History:  Procedure Laterality Date  . HERNIA REPAIR         Home Medications    Prior to Admission medications   Medication Sig Start Date End Date Taking? Authorizing Provider  ipratropium (ATROVENT) 0.06 % nasal spray Place 2 sprays into both nostrils 4 (four) times daily. Patient not taking: Reported on 01/03/2017 11/25/16   Deatra Canter, FNP  naproxen (NAPROSYN) 250 MG tablet Take 1 tablet (250 mg total) by mouth 2 (two) times daily with a meal. Patient not taking: Reported on 01/03/2017 11/25/16   Deatra Canter, FNP    Family History History reviewed. No pertinent family history.  Social History Social History   Tobacco Use  . Smoking status: Former Games developer  . Smokeless tobacco: Never Used  Substance Use Topics  . Alcohol use: No    Alcohol/week: 0.0 oz  . Drug use: No     Allergies   Patient has no known allergies.   Review of Systems Review of Systems  Constitutional: Negative.   HENT: Negative.   Eyes: Positive for visual disturbance.       Vision is presently normal  Respiratory: Negative.   Cardiovascular:  Negative.   Gastrointestinal: Negative.   Musculoskeletal: Negative.   Skin: Negative.   Neurological: Positive for headaches.  Psychiatric/Behavioral: Negative.   All other systems reviewed and are negative.    Physical Exam Updated Vital Signs BP 132/85   Pulse 75   Temp 98.8 F (37.1 C) (Oral)   Resp 18   SpO2 100%   Physical Exam  Constitutional: He is oriented to person, place, and time. He appears well-developed and well-nourished.  HENT:  Head: Normocephalic and atraumatic.  Eyes: Conjunctivae and EOM are normal. Pupils are equal, round, and reactive to light.  Lateral conjunctival erythema  Neck: Neck supple. No tracheal deviation present. No thyromegaly present.  Cardiovascular: Normal rate, regular rhythm and normal heart sounds.  No murmur heard. Pulmonary/Chest: Effort normal and breath sounds normal.  Abdominal: Soft. Bowel sounds are normal. He exhibits no distension. There is no tenderness.  Musculoskeletal: Normal range of motion. He exhibits no edema or tenderness.  Neurological: He is alert and oriented to person, place, and time. Coordination normal.  Gait normal motor strength 5/5 overall cranial nerves II through XII grossly intact  Skin: Skin is warm and dry. No rash noted.  Psychiatric: He has a normal mood and affect.  Nursing note and vitals reviewed.    ED Treatments / Results  Labs (all labs ordered are listed,  but only abnormal results are displayed) Labs Reviewed  COMPREHENSIVE METABOLIC PANEL - Abnormal; Notable for the following components:      Result Value   Glucose, Bld 103 (*)    ALT 15 (*)    All other components within normal limits  CBG MONITORING, ED - Abnormal; Notable for the following components:   Glucose-Capillary 103 (*)    All other components within normal limits  CBC WITH DIFFERENTIAL/PLATELET    EKG  EKG Interpretation None       Radiology Ct Head Wo Contrast  Result Date: 11/30/2017 CLINICAL DATA:   Headaches for 2 weeks with visual changes. EXAM: CT HEAD WITHOUT CONTRAST TECHNIQUE: Contiguous axial images were obtained from the base of the skull through the vertex without intravenous contrast. COMPARISON:  01/03/2017 FINDINGS: Brain: The brainstem, cerebellum, cerebral peduncles, thalami, basal ganglia, basilar cisterns, and ventricular system appear within normal limits. Periventricular white matter and corona radiata hypodensities favor chronic ischemic microvascular white matter disease. No intracranial hemorrhage, mass lesion, or acute CVA. Vascular: Unremarkable Skull: Unremarkable Sinuses/Orbits: Unremarkable Other: No supplemental non-categorized findings. IMPRESSION: 1. No acute intracranial findings. 2. Periventricular white matter and corona radiata hypodensities favor chronic ischemic microvascular white matter disease. Electronically Signed   By: Gaylyn RongWalter  Liebkemann M.D.   On: 11/30/2017 20:11    Procedures Procedures (including critical care time)  Medications Ordered in ED Medications - No data to display  Results for orders placed or performed during the hospital encounter of 11/30/17  Comprehensive metabolic panel  Result Value Ref Range   Sodium 139 135 - 145 mmol/L   Potassium 3.9 3.5 - 5.1 mmol/L   Chloride 104 101 - 111 mmol/L   CO2 24 22 - 32 mmol/L   Glucose, Bld 103 (H) 65 - 99 mg/dL   BUN 12 6 - 20 mg/dL   Creatinine, Ser 9.601.01 0.61 - 1.24 mg/dL   Calcium 9.5 8.9 - 45.410.3 mg/dL   Total Protein 7.1 6.5 - 8.1 g/dL   Albumin 4.2 3.5 - 5.0 g/dL   AST 25 15 - 41 U/L   ALT 15 (L) 17 - 63 U/L   Alkaline Phosphatase 70 38 - 126 U/L   Total Bilirubin 0.6 0.3 - 1.2 mg/dL   GFR calc non Af Amer >60 >60 mL/min   GFR calc Af Amer >60 >60 mL/min   Anion gap 11 5 - 15  CBC with Differential  Result Value Ref Range   WBC 6.1 4.0 - 10.5 K/uL   RBC 5.24 4.22 - 5.81 MIL/uL   Hemoglobin 15.8 13.0 - 17.0 g/dL   HCT 09.845.9 11.939.0 - 14.752.0 %   MCV 87.6 78.0 - 100.0 fL   MCH 30.2 26.0  - 34.0 pg   MCHC 34.4 30.0 - 36.0 g/dL   RDW 82.913.5 56.211.5 - 13.015.5 %   Platelets 216 150 - 400 K/uL   Neutrophils Relative % 55 %   Neutro Abs 3.4 1.7 - 7.7 K/uL   Lymphocytes Relative 36 %   Lymphs Abs 2.2 0.7 - 4.0 K/uL   Monocytes Relative 8 %   Monocytes Absolute 0.5 0.1 - 1.0 K/uL   Eosinophils Relative 1 %   Eosinophils Absolute 0.0 0.0 - 0.7 K/uL   Basophils Relative 0 %   Basophils Absolute 0.0 0.0 - 0.1 K/uL  CBG monitoring, ED  Result Value Ref Range   Glucose-Capillary 103 (H) 65 - 99 mg/dL   Ct Head Wo Contrast  Result Date: 11/30/2017 CLINICAL DATA:  Headaches for 2 weeks with visual changes. EXAM: CT HEAD WITHOUT CONTRAST TECHNIQUE: Contiguous axial images were obtained from the base of the skull through the vertex without intravenous contrast. COMPARISON:  01/03/2017 FINDINGS: Brain: The brainstem, cerebellum, cerebral peduncles, thalami, basal ganglia, basilar cisterns, and ventricular system appear within normal limits. Periventricular white matter and corona radiata hypodensities favor chronic ischemic microvascular white matter disease. No intracranial hemorrhage, mass lesion, or acute CVA. Vascular: Unremarkable Skull: Unremarkable Sinuses/Orbits: Unremarkable Other: No supplemental non-categorized findings. IMPRESSION: 1. No acute intracranial findings. 2. Periventricular white matter and corona radiata hypodensities favor chronic ischemic microvascular white matter disease. Electronically Signed   By: Gaylyn Rong M.D.   On: 11/30/2017 20:11   Initial Impression / Assessment and Plan / ED Course  I have reviewed the triage vital signs and the nursing notes.  Pertinent labs & imaging results that were available during my care of the patient were reviewed by me and considered in my medical decision making (see chart for details).     Plan referral to primary care  Final Clinical Impressions(s) / ED Diagnoses  Diagnosis bad headache Final diagnoses:  Bad headache     ED Discharge Orders    None       Doug Sou, MD 11/30/17 2158

## 2017-11-30 NOTE — ED Provider Notes (Addendum)
Patient placed in Quick Look pathway, seen and evaluated   Chief Complaint: Intermittent headaches, blurry vision, polyuria, polydipsia  HPI:   Patient resents to the ED with complaints of intermittent headaches for the past month.  Patient states that the headaches are gradual in onset and there are no specific one place.  Patient reports associated vision changes.  States that his eyes are very foggy this is been going for the past several months.  Denies any double vision.  Does report decreased vision at times.  Also reports polyuria and polydipsia.  Is concerned that his blood sugar is elevated but he has no history of diabetes.  Patient does not taking medications regularly.  Denies any aphasia, ataxia, facial droop, weakness or paresthesias.  Denies any associated nausea, vomiting, diarrhea, fever or neck pain.   ROS: Polyuria, polydipsia, headache, vision changes (one)  Physical Exam:   Gen: No distress  Neuro: Awake and Alert  Skin: Warm    Focused Exam: The patient is alert, attentive, and oriented x 3. Speech is clear. Cranial nerve II-VII grossly intact. Negative pronator drift. Sensation intact. Strength 5/5 in all extremities. Reflexes 2+ and symmetric at biceps, triceps, knees, and ankles. Rapid alternating movement and fine finger movements intact. Romberg is absent. Posture and gait normal.  Decreased peripheral vision.  Injected conjunctive a.  Heart regular rate and rhythm.  Lungs clear to auscultation bilaterally.  Pulses equal in all extremities.  Extremities are warm to touch. No nuchal rigidity.    Initiation of care has begun. The patient has been counseled on the process, plan, and necessity for staying for the completion/evaluation, and the remainder of the medical screening examination   Discussed with the patient that exiting the department prior to completion of the work-up is AMA and there is no guarantee that there are no emergency medical conditions present.      Rise MuLeaphart, Rochell Puett T, PA-C 11/30/17 1904    Rise MuLeaphart, Melia Hopes T, PA-C 11/30/17 1905    Tegeler, Canary Brimhristopher J, MD 11/30/17 [phone]

## 2017-11-30 NOTE — ED Triage Notes (Signed)
Pt reports having headaches x 2 weeks. Has vision changes when headaches occur. Denies n/v. No acute distress is noted at triage. Denies having a headache at this time.

## 2017-11-30 NOTE — ED Notes (Signed)
Pt remains in waiting room. Updated on wait for treatment room. 

## 2017-12-04 DIAGNOSIS — H2513 Age-related nuclear cataract, bilateral: Secondary | ICD-10-CM | POA: Diagnosis not present

## 2017-12-04 DIAGNOSIS — H2511 Age-related nuclear cataract, right eye: Secondary | ICD-10-CM | POA: Diagnosis not present

## 2017-12-04 DIAGNOSIS — H25811 Combined forms of age-related cataract, right eye: Secondary | ICD-10-CM | POA: Diagnosis not present

## 2017-12-04 DIAGNOSIS — Z961 Presence of intraocular lens: Secondary | ICD-10-CM | POA: Diagnosis not present

## 2018-01-08 DIAGNOSIS — H43393 Other vitreous opacities, bilateral: Secondary | ICD-10-CM | POA: Diagnosis not present

## 2018-03-30 DIAGNOSIS — H00021 Hordeolum internum right upper eyelid: Secondary | ICD-10-CM | POA: Diagnosis not present

## 2018-04-08 DIAGNOSIS — H1013 Acute atopic conjunctivitis, bilateral: Secondary | ICD-10-CM | POA: Diagnosis not present

## 2018-05-28 ENCOUNTER — Encounter (HOSPITAL_COMMUNITY): Payer: Self-pay | Admitting: Emergency Medicine

## 2018-05-28 ENCOUNTER — Ambulatory Visit (HOSPITAL_COMMUNITY)
Admission: EM | Admit: 2018-05-28 | Discharge: 2018-05-28 | Disposition: A | Discharge status: Home / Self Care | Payer: Medicare HMO

## 2018-05-28 DIAGNOSIS — H1013 Acute atopic conjunctivitis, bilateral: Secondary | ICD-10-CM

## 2018-05-28 NOTE — ED Triage Notes (Signed)
Pt sts frontal HA intermittently x 1 week

## 2018-05-28 NOTE — Discharge Instructions (Signed)
I personally discussed her case with the staff of [facility]. They would like to see you today in the office. Go directly there from here for further evaluation of your eye complaints.

## 2018-05-28 NOTE — ED Provider Notes (Signed)
[facility]    CSN: [id number] Arrival date & time: 05/28/18  0915     History   Chief Complaint Chief Complaint  Patient presents with  . Headache    HPI Lamont Snowballrthur J Zeiger is a 84 y.o. male.   Subjective:  Lamont Snowballrthur J Heap is a 84 y.o. male who presents for evaluation of decreased vision and foggy vision. He has noticed the above symptoms in the bilateral eye for a few days. Onset was gradual and occurs intermittently. Patient denies discharge, erythema, foreign body sensation, itching, pain, photophobia, tearing and visual field deficit. There is a history of allergies and eye surgeries.   Notably, the patient was seen back in July at the [facility] in [place], [place] for similar complaints. He was prescribed eye drops at that time which was helpful. He has ran out of these drops recently and now these symptoms have returned.   The following portions of the patient's history were reviewed and updated as appropriate: allergies, current medications, past family history, past medical history, past social history, past surgical history and problem list.          Past Medical History:  Diagnosis Date  . Arthritis     There are no active problems to display for this patient.   Past Surgical History:  Procedure Laterality Date  . HERNIA REPAIR         Home Medications    Prior to Admission medications   Medication Sig Start Date End Date Taking? Authorizing Provider  ipratropium (ATROVENT) 0.06 % nasal spray Place 2 sprays into both nostrils 4 (four) times daily. Patient not taking: Reported on 01/03/2017 11/25/16   Deatra Canterxford, William J, FNP  naproxen (NAPROSYN) 250 MG tablet Take 1 tablet (250 mg total) by mouth 2 (two) times daily with a meal. Patient not taking: Reported on 01/03/2017 11/25/16   Deatra Canterxford, William J, FNP    Family History History reviewed. No pertinent family history.  Social History Social History   Tobacco Use  . Smoking status:  Former Games developermoker  . Smokeless tobacco: Never Used  Substance Use Topics  . Alcohol use: No    Alcohol/week: 0.0 standard drinks  . Drug use: No     Allergies   Patient has no known allergies.   Review of Systems Review of Systems  Eyes: Positive for visual disturbance. Negative for photophobia, pain, discharge, redness and itching.  Neurological: Negative for dizziness, light-headedness and headaches.  All other systems reviewed and are negative.    Physical Exam Triage Vital Signs ED Triage Vitals [05/28/18 1031]  Enc Vitals Group     BP 130/74     Pulse Rate 68     Resp 18     Temp 97.6 F (36.4 C)     Temp Source Oral     SpO2 97 %     Weight      Height      Head Circumference      Peak Flow      Pain Score      Pain Loc      Pain Edu?      Excl. in GC?    No data found.  Updated Vital Signs BP 130/74 (BP Location: Left Arm)   Pulse 68   Temp 97.6 F (36.4 C) (Oral)   Resp 18   SpO2 97%   Visual Acuity Right Eye Distance:   Left Eye Distance:   Bilateral Distance:    Right  Eye Near:   Left Eye Near:    Bilateral Near:     Physical Exam  Constitutional: He is oriented to person, place, and time. He appears well-developed and well-nourished.  HENT:  Head: Normocephalic.  Eyes: Pupils are equal, round, and reactive to light. Conjunctivae, EOM and lids are normal.  Neck: Normal range of motion. Neck supple.  Cardiovascular: Normal rate.  Pulmonary/Chest: Effort normal.  Musculoskeletal: Normal range of motion.  Neurological: He is alert and oriented to person, place, and time. He has normal strength. No cranial nerve deficit.  Skin: Skin is warm and dry.  Psychiatric: He has a normal mood and affect.     UC Treatments / Results  Labs (all labs ordered are listed, but only abnormal results are displayed) Labs Reviewed - No data to display  EKG None  Radiology No results found.  Procedures Procedures (including critical care  time)  Medications Ordered in UC Medications - No data to display  Initial Impression / Assessment and Plan / UC Course  I have reviewed the triage vital signs and the nursing notes.  Pertinent labs & imaging results that were available during my care of the patient were reviewed by me and considered in my medical decision making (see chart for details).    84 yo male presenting with foggy vision intermittently for the past couple of days. Patient reports similar presentation back in July. He was evaluated by opthalmology at that time and prescribed some eye drops which relieved his symptoms. He has recently ran out of these drops and the symptoms have returned. Patient request a refill of the eye drops but is unsure of the name of the medication.   [facility] called [facility] and spoke with staff member. Patient was seen in their clinic on April 08, 2018 and given olopatadine drops. Staff requested that we send the patient to their clinic so that the ophthalmologist can re-evaluate the patient. Deferred any treatment at this time. Patient instructed to go immediately to the ophthalmologist office for evaluation and management.   Today's evaluation has revealed no signs of a dangerous process. Discussed diagnosis with patient. Patient aware of their diagnosis, possible red flag symptoms to watch out for and need for close follow up. Patient understands verbal and written discharge instructions. Patient comfortable with plan and disposition.  Patient has a clear mental status at this time, good insight into illness (after discussion and teaching) and has clear judgment to make decisions regarding their care.  Documentation was completed with the aid of voice recognition software. Transcription may contain typographical errors. Final Clinical Impressions(s) / UC Diagnoses   Final diagnoses:  Allergic conjunctivitis of both eyes     Discharge Instructions     I personally discussed her case  with the staff of [facility]. They would like to see you today in the office. Go directly there from here for further evaluation of your eye complaints.    ED Prescriptions    None     Controlled Substance Prescriptions  Controlled Substance Registry consulted? Not Applicable   Lurline IdolMurrill, Terek Bee, FNP 05/28/18 1113

## 2018-06-28 DIAGNOSIS — H10023 Other mucopurulent conjunctivitis, bilateral: Secondary | ICD-10-CM | POA: Diagnosis not present

## 2018-07-05 DIAGNOSIS — H04123 Dry eye syndrome of bilateral lacrimal glands: Secondary | ICD-10-CM | POA: Diagnosis not present

## 2018-08-09 DIAGNOSIS — Z0000 Encounter for general adult medical examination without abnormal findings: Secondary | ICD-10-CM | POA: Diagnosis not present

## 2018-08-09 DIAGNOSIS — Z87891 Personal history of nicotine dependence: Secondary | ICD-10-CM | POA: Diagnosis not present

## 2018-08-09 DIAGNOSIS — J209 Acute bronchitis, unspecified: Secondary | ICD-10-CM | POA: Diagnosis not present

## 2018-08-09 DIAGNOSIS — Z6830 Body mass index (BMI) 30.0-30.9, adult: Secondary | ICD-10-CM | POA: Diagnosis not present

## 2018-08-09 DIAGNOSIS — E6609 Other obesity due to excess calories: Secondary | ICD-10-CM | POA: Diagnosis not present

## 2018-08-09 DIAGNOSIS — Z008 Encounter for other general examination: Secondary | ICD-10-CM | POA: Diagnosis not present

## 2019-08-11 ENCOUNTER — Other Ambulatory Visit: Payer: Self-pay

## 2019-08-11 DIAGNOSIS — Z20822 Contact with and (suspected) exposure to covid-19: Secondary | ICD-10-CM

## 2019-08-12 ENCOUNTER — Other Ambulatory Visit: Payer: Self-pay

## 2019-08-12 LAB — NOVEL CORONAVIRUS, NAA: SARS-CoV-2, NAA: NOT DETECTED

## 2019-08-29 ENCOUNTER — Other Ambulatory Visit: Payer: Self-pay

## 2019-08-29 DIAGNOSIS — Z20822 Contact with and (suspected) exposure to covid-19: Secondary | ICD-10-CM

## 2019-08-31 LAB — NOVEL CORONAVIRUS, NAA: SARS-CoV-2, NAA: NOT DETECTED

## 2021-05-30 ENCOUNTER — Emergency Department (HOSPITAL_COMMUNITY)
Admission: EM | Admit: 2021-05-30 | Discharge: 2021-05-30 | Disposition: A | Discharge status: Home / Self Care | Payer: Medicare Other | Attending: Emergency Medicine | Admitting: Emergency Medicine

## 2021-05-30 ENCOUNTER — Emergency Department (HOSPITAL_COMMUNITY): Payer: Medicare Other

## 2021-05-30 DIAGNOSIS — R471 Dysarthria and anarthria: Secondary | ICD-10-CM | POA: Insufficient documentation

## 2021-05-30 DIAGNOSIS — Z87891 Personal history of nicotine dependence: Secondary | ICD-10-CM | POA: Diagnosis not present

## 2021-05-30 DIAGNOSIS — K0889 Other specified disorders of teeth and supporting structures: Secondary | ICD-10-CM

## 2021-05-30 DIAGNOSIS — Z972 Presence of dental prosthetic device (complete) (partial): Secondary | ICD-10-CM

## 2021-05-30 LAB — CBC WITH DIFFERENTIAL/PLATELET
Abs Immature Granulocytes: 0.05 10*3/uL (ref 0.00–0.07)
Basophils Absolute: 0 10*3/uL (ref 0.0–0.1)
Basophils Relative: 1 %
Eosinophils Absolute: 0 10*3/uL (ref 0.0–0.5)
Eosinophils Relative: 1 %
HCT: 47.3 % (ref 39.0–52.0)
Hemoglobin: 15.5 g/dL (ref 13.0–17.0)
Immature Granulocytes: 1 %
Lymphocytes Relative: 24 %
Lymphs Abs: 1.3 10*3/uL (ref 0.7–4.0)
MCH: 28.1 pg (ref 26.0–34.0)
MCHC: 32.8 g/dL (ref 30.0–36.0)
MCV: 85.7 fL (ref 80.0–100.0)
Monocytes Absolute: 0.8 10*3/uL (ref 0.1–1.0)
Monocytes Relative: 15 %
Neutro Abs: 3.2 10*3/uL (ref 1.7–7.7)
Neutrophils Relative %: 58 %
Platelets: 264 10*3/uL (ref 150–400)
RBC: 5.52 MIL/uL (ref 4.22–5.81)
RDW: 14.1 % (ref 11.5–15.5)
WBC: 5.4 10*3/uL (ref 4.0–10.5)
nRBC: 0 % (ref 0.0–0.2)

## 2021-05-30 LAB — COMPREHENSIVE METABOLIC PANEL
ALT: 15 U/L (ref 0–44)
AST: 23 U/L (ref 15–41)
Albumin: 3.6 g/dL (ref 3.5–5.0)
Alkaline Phosphatase: 53 U/L (ref 38–126)
Anion gap: 8 (ref 5–15)
BUN: 13 mg/dL (ref 8–23)
CO2: 26 mmol/L (ref 22–32)
Calcium: 8.9 mg/dL (ref 8.9–10.3)
Chloride: 107 mmol/L (ref 98–111)
Creatinine, Ser: 1.13 mg/dL (ref 0.61–1.24)
GFR, Estimated: >60 mL/min (ref >60)
Glucose, Bld: 91 mg/dL (ref 70–99)
Potassium: 3.1 mmol/L — ABNORMAL LOW (ref 3.5–5.1)
Sodium: 141 mmol/L (ref 135–145)
Total Bilirubin: 0.8 mg/dL (ref 0.3–1.2)
Total Protein: 6.2 g/dL — ABNORMAL LOW (ref 6.5–8.1)

## 2021-05-30 LAB — TSH: TSH: 0.877 u[IU]/mL (ref 0.350–4.500)

## 2021-05-30 NOTE — ED Triage Notes (Signed)
Pt BIB [facility] after churchmembers and family reports slurred speech different from baseline. EMS reports family called a clinic and they called 911 on the pt's behalf. Pt did not endorse feeling different from normal to EMS.

## 2021-05-30 NOTE — ED Provider Notes (Signed)
  Physical Exam  BP 137/84   Pulse 74   Temp 99.4 F (37.4 C) (Oral)   Resp 17   SpO2 98%   Physical Exam  ED Course/Procedures     Procedures  MDM  Mr. Iwanicki was awaiting the results of an MRI.  Further history was obtained, and per his sister, the patient has been experiencing the symptoms for several months at least.  I examined the patient, and I had him read the NIH stroke scale cards.  He did have some dysarthria which was especially pronounced when reading certain consonants.  He is illiterate but was able to repeat syllables after me.  He took his dentures out, and surprisingly, he was actually able to read the cards much better without these.  He states they have been ill fitting and have been problematic for at least 5 years.  I talked to the patient and his sister.  I told him that his MRI is normal which is very reassuring that he has not had a stroke.  We have not imaged his vessels to evaluate whether or not he is at risk for a stroke, but given the timeframe and the association with his dentures, this is unlikely to be a neurologic process.  The patient was given option to go home with follow-up or to be admitted for an observation admission and further stroke work-up.  He would like to go home, and he does have primary care follow-up.  He is interested in home health, and because his elderly sister cares for him, she was also interested in this service.  A transition of care consult was placed.  He was given careful return precautions and patient information detailing the signs of a stroke should new neurologic symptoms develop.     Koleen Distance, MD 05/30/21 [phone]

## 2021-05-30 NOTE — ED Notes (Signed)
Lab at bedside for blood draw, MRI called for report.

## 2021-05-30 NOTE — ED Provider Notes (Signed)
[facility] EMERGENCY DEPARTMENT Provider Note   CSN: [id number] Arrival date & time: 05/30/21  1511     History Chief Complaint  Patient presents with   Aphasia    Cameron Barton is a 87 y.o. male.  The history is provided by the patient and medical records. No language interpreter was used.  Neurologic Problem This is a new problem. The problem occurs constantly. The problem has not changed since onset.Pertinent negatives include no chest pain, no abdominal pain, no headaches and no shortness of breath. Nothing aggravates the symptoms. Nothing relieves the symptoms. He has tried nothing for the symptoms. The treatment provided no relief.      Past Medical History:  Diagnosis Date   Arthritis     There are no problems to display for this patient.   Past Surgical History:  Procedure Laterality Date   HERNIA REPAIR         No family history on file.  Social History   Tobacco Use   Smoking status: Former   Smokeless tobacco: Never  Substance Use Topics   Alcohol use: No    Alcohol/week: 0.0 standard drinks   Drug use: No    Home Medications Prior to Admission medications   Medication Sig Start Date End Date Taking? Authorizing Provider  ipratropium (ATROVENT) 0.06 % nasal spray Place 2 sprays into both nostrils 4 (four) times daily. Patient not taking: Reported on 01/03/2017 11/25/16   Deatra Canter, FNP  naproxen (NAPROSYN) 250 MG tablet Take 1 tablet (250 mg total) by mouth 2 (two) times daily with a meal. Patient not taking: Reported on 01/03/2017 11/25/16   Deatra Canter, FNP    Allergies    Patient has no known allergies.  Review of Systems   Review of Systems  Constitutional:  Negative for chills, fatigue and fever.  HENT:  Negative for congestion.   Eyes:  Negative for visual disturbance.  Respiratory:  Negative for chest tightness, shortness of breath and wheezing.   Cardiovascular:  Negative for chest pain.   Gastrointestinal:  Negative for abdominal pain, constipation, diarrhea and nausea.  Genitourinary:  Negative for dysuria and flank pain.  Musculoskeletal:  Negative for back pain, neck pain and neck stiffness.  Skin:  Negative for rash and wound.  Neurological:  Positive for speech difficulty. Negative for dizziness, facial asymmetry, weakness, light-headedness, numbness and headaches.  Psychiatric/Behavioral:  Negative for agitation and confusion.   All other systems reviewed and are negative.  Physical Exam Updated Vital Signs BP (!) 149/78   Pulse 77   Temp 99.4 F (37.4 C) (Oral)   Resp (!) 28   SpO2 97%   Physical Exam Vitals and nursing note reviewed.  Constitutional:      General: He is not in acute distress.    Appearance: He is well-developed. He is not ill-appearing, toxic-appearing or diaphoretic.  HENT:     Head: Normocephalic and atraumatic.     Mouth/Throat:     Mouth: Mucous membranes are moist.  Eyes:     Extraocular Movements: Extraocular movements intact.     Conjunctiva/sclera: Conjunctivae normal.     Pupils: Pupils are equal, round, and reactive to light.  Cardiovascular:     Rate and Rhythm: Normal rate and regular rhythm.     Heart sounds: No murmur heard. Pulmonary:     Effort: Pulmonary effort is normal. No respiratory distress.     Breath sounds: Normal breath sounds. No wheezing, rhonchi or rales.  Chest:     Chest wall: No tenderness.  Abdominal:     Palpations: Abdomen is soft.     Tenderness: There is no abdominal tenderness.  Musculoskeletal:        General: No tenderness.     Cervical back: Neck supple. No tenderness.  Skin:    General: Skin is warm and dry.     Findings: No erythema.  Neurological:     Mental Status: He is alert.     Cranial Nerves: Dysarthria present. No facial asymmetry.     Sensory: No sensory deficit.     Motor: No weakness or abnormal muscle tone.     Coordination: Finger-Nose-Finger Test normal.      Comments: Dysarthria present.  Patient complaining of aphasia but he was able to get out what he wanted to say for me.  Psychiatric:        Mood and Affect: Mood normal.    ED Results / Procedures / Treatments   Labs (all labs ordered are listed, but only abnormal results are displayed) Labs Reviewed  CBC WITH DIFFERENTIAL/PLATELET  COMPREHENSIVE METABOLIC PANEL  TSH  URINALYSIS, ROUTINE W REFLEX MICROSCOPIC    EKG EKG Interpretation  Date/Time:  Monday May 30 2021 15:27:25 EDT Ventricular Rate:  77 PR Interval:  159 QRS Duration: 88 QT Interval:  367 QTC Calculation: 416 R Axis:   15 Text Interpretation: Sinus rhythm Probable left atrial enlargement Borderline low voltage, extremity leads When compared to prior, similar appearance. No STEMI Confirmed by Theda Belfast ([id number]) on 05/30/2021 4:26:38 PM  Radiology No results found.  Procedures Procedures   Medications Ordered in ED Medications - No data to display  ED Course  I have reviewed the triage vital signs and the nursing notes.  Pertinent labs & imaging results that were available during my care of the patient were reviewed by me and considered in my medical decision making (see chart for details).    MDM Rules/Calculators/A&P                           Cameron Barton is a 87 y.o. male with unknown past medical history who presents for speech difficulty.  According to EMS and patient, he noted yesterday while speaking to a minister at church that he was having difficulty with his speech.  Today, he spoke to family in the phone who subsequently called his PCP who called 911.  Patient says that it was at least yesterday when he started having the difficulty speaking and he feels that is both aphasia and dysarthria.  He initially attributed to having false teeth on the top of his mouth however once he replaced them, he still agrees to speech is not quite right.  He denies any headache, vision changes, nausea,  vomiting, fevers, chills, neck pain, neck stiffness, numbness, tingling, or weakness of extremities, or any other neurologic deficits.  On exam, lungs clear and chest nontender.  Abdomen nontender.  Patient moving all extremities.  Normal finger-nose-finger testing.  Pupil symmetric and reactive normal extraocular movements.  Speech is dysarthric and patient describes difficulty getting what he wants to say out concerning for aphasia.  He was not aphasic for me.  As patient reports speech was not like this several days ago, I am concerned he may have had a stroke.  We will get MRI as we do not have a good last normal but it was at least over 24 hours ago  without onset of symptoms.  We will also get basic labs.  Anticipate neurology consultation after work-up was completed to determine disposition.  Care transferred to oncoming team while waiting for work-up results.  Final Clinical Impression(s) / ED Diagnoses Final diagnoses:  Dysarthria     Clinical Impression: 1. Dysarthria     Disposition: Care transferred to oncoming team while waiting for results of work-up.  Suspicion for stroke versus TIA  This note was prepared with assistance of Dragon voice recognition software. Occasional wrong-word or sound-a-like substitutions may have occurred due to the inherent limitations of voice recognition software.     Jakeob Tullis, Canary Brim, MD 05/30/21 [phone]

## 2021-10-02 ENCOUNTER — Observation Stay (HOSPITAL_COMMUNITY): Payer: Medicare Other | Admitting: Certified Registered Nurse Anesthetist

## 2021-10-02 ENCOUNTER — Observation Stay (HOSPITAL_COMMUNITY): Payer: Medicare Other

## 2021-10-02 ENCOUNTER — Encounter (HOSPITAL_COMMUNITY): Admission: EM | Disposition: A | Discharge status: Home Health | Payer: Self-pay | Source: Home / Self Care

## 2021-10-02 ENCOUNTER — Emergency Department (HOSPITAL_COMMUNITY): Payer: Medicare Other

## 2021-10-02 ENCOUNTER — Other Ambulatory Visit: Payer: Self-pay

## 2021-10-02 ENCOUNTER — Encounter (HOSPITAL_COMMUNITY): Payer: Self-pay | Admitting: Emergency Medicine

## 2021-10-02 ENCOUNTER — Inpatient Hospital Stay (HOSPITAL_COMMUNITY)
Admission: EM | Admit: 2021-10-02 | Discharge: 2021-10-05 | DRG: 352 | Disposition: A | Discharge status: Home Health | Payer: Medicare Other | Attending: Surgery | Admitting: Surgery

## 2021-10-02 DIAGNOSIS — Z87891 Personal history of nicotine dependence: Secondary | ICD-10-CM

## 2021-10-02 DIAGNOSIS — K4030 Unilateral inguinal hernia, with obstruction, without gangrene, not specified as recurrent: Principal | ICD-10-CM | POA: Diagnosis present

## 2021-10-02 DIAGNOSIS — R4781 Slurred speech: Secondary | ICD-10-CM | POA: Diagnosis present

## 2021-10-02 DIAGNOSIS — R112 Nausea with vomiting, unspecified: Secondary | ICD-10-CM

## 2021-10-02 DIAGNOSIS — Z20822 Contact with and (suspected) exposure to covid-19: Secondary | ICD-10-CM | POA: Diagnosis present

## 2021-10-02 DIAGNOSIS — M1990 Unspecified osteoarthritis, unspecified site: Secondary | ICD-10-CM | POA: Diagnosis present

## 2021-10-02 DIAGNOSIS — Z0189 Encounter for other specified special examinations: Secondary | ICD-10-CM

## 2021-10-02 DIAGNOSIS — K4040 Unilateral inguinal hernia, with gangrene, not specified as recurrent: Secondary | ICD-10-CM | POA: Diagnosis not present

## 2021-10-02 DIAGNOSIS — K4090 Unilateral inguinal hernia, without obstruction or gangrene, not specified as recurrent: Secondary | ICD-10-CM

## 2021-10-02 HISTORY — PX: INGUINAL HERNIA REPAIR: SHX194

## 2021-10-02 LAB — LACTIC ACID, PLASMA: Lactic Acid, Venous: 3.1 mmol/L (ref 0.5–1.9)

## 2021-10-02 LAB — CBC WITH DIFFERENTIAL/PLATELET
Abs Immature Granulocytes: 0.09 10*3/uL — ABNORMAL HIGH (ref 0.00–0.07)
Basophils Absolute: 0 10*3/uL (ref 0.0–0.1)
Basophils Relative: 0 %
Eosinophils Absolute: 0 10*3/uL (ref 0.0–0.5)
Eosinophils Relative: 0 %
HCT: 54.8 % — ABNORMAL HIGH (ref 39.0–52.0)
Hemoglobin: 17.9 g/dL — ABNORMAL HIGH (ref 13.0–17.0)
Immature Granulocytes: 1 %
Lymphocytes Relative: 12 %
Lymphs Abs: 1.2 10*3/uL (ref 0.7–4.0)
MCH: 27.8 pg (ref 26.0–34.0)
MCHC: 32.7 g/dL (ref 30.0–36.0)
MCV: 85.2 fL (ref 80.0–100.0)
Monocytes Absolute: 0.6 10*3/uL (ref 0.1–1.0)
Monocytes Relative: 6 %
Neutro Abs: 7.7 10*3/uL (ref 1.7–7.7)
Neutrophils Relative %: 81 %
Platelets: 405 10*3/uL — ABNORMAL HIGH (ref 150–400)
RBC: 6.43 MIL/uL — ABNORMAL HIGH (ref 4.22–5.81)
RDW: 14.7 % (ref 11.5–15.5)
WBC: 9.6 10*3/uL (ref 4.0–10.5)
nRBC: 0 % (ref 0.0–0.2)

## 2021-10-02 LAB — TROPONIN I (HIGH SENSITIVITY): Troponin I (High Sensitivity): 4 ng/L (ref <18)

## 2021-10-02 LAB — BASIC METABOLIC PANEL
Anion gap: 15 (ref 5–15)
CO2: 20 mmol/L — ABNORMAL LOW (ref 22–32)
Calcium: 9.4 mg/dL (ref 8.9–10.3)
Chloride: 104 mmol/L (ref 98–111)
Creatinine, Ser: 0.99 mg/dL (ref 0.61–1.24)
Glucose, Bld: 90 mg/dL (ref 70–99)
Potassium: 5 mmol/L (ref 3.5–5.1)
Sodium: 139 mmol/L (ref 135–145)

## 2021-10-02 LAB — MAGNESIUM: Magnesium: 2.7 mg/dL — ABNORMAL HIGH (ref 1.7–2.4)

## 2021-10-02 LAB — LIPASE, BLOOD: Lipase: 22 U/L (ref 11–51)

## 2021-10-02 LAB — RESP PANEL BY RT-PCR (FLU A&B, COVID) ARPGX2
Influenza A by PCR: NEGATIVE
Influenza B by PCR: NEGATIVE
SARS Coronavirus 2 by RT PCR: NEGATIVE

## 2021-10-02 SURGERY — REPAIR, HERNIA, INGUINAL, INCARCERATED
Anesthesia: General | Laterality: Left

## 2021-10-02 MED ORDER — FENTANYL CITRATE (PF) 100 MCG/2ML IJ SOLN
INTRAMUSCULAR | Status: AC
  Filled 2021-10-02: qty 2

## 2021-10-02 MED ORDER — LIDOCAINE 2% (20 MG/ML) 5 ML SYRINGE
INTRAMUSCULAR | Status: DC | PRN
  Administered 2021-10-02: 60 mg via INTRAVENOUS

## 2021-10-02 MED ORDER — FENTANYL CITRATE PF 50 MCG/ML IJ SOSY
25.0000 ug | PREFILLED_SYRINGE | Freq: Once | INTRAMUSCULAR | Status: AC
  Administered 2021-10-02: 18:00:00 25 ug via INTRAVENOUS
  Filled 2021-10-02: qty 1

## 2021-10-02 MED ORDER — ENOXAPARIN SODIUM 40 MG/0.4ML IJ SOSY
40.0000 mg | PREFILLED_SYRINGE | INTRAMUSCULAR | Status: DC
  Administered 2021-10-03 – 2021-10-04 (×2): 40 mg via SUBCUTANEOUS
  Filled 2021-10-02 (×2): qty 0.4

## 2021-10-02 MED ORDER — DEXAMETHASONE SODIUM PHOSPHATE 10 MG/ML IJ SOLN
INTRAMUSCULAR | Status: AC
  Filled 2021-10-02: qty 1

## 2021-10-02 MED ORDER — ONDANSETRON HCL 4 MG/2ML IJ SOLN
INTRAMUSCULAR | Status: AC
  Filled 2021-10-02: qty 2

## 2021-10-02 MED ORDER — ONDANSETRON HCL 4 MG/2ML IJ SOLN
4.0000 mg | Freq: Once | INTRAMUSCULAR | Status: AC
  Administered 2021-10-02: 18:00:00 4 mg via INTRAVENOUS
  Filled 2021-10-02: qty 2

## 2021-10-02 MED ORDER — BUPIVACAINE-EPINEPHRINE 0.25% -1:200000 IJ SOLN
INTRAMUSCULAR | Status: DC | PRN
  Administered 2021-10-02: 50 mL

## 2021-10-02 MED ORDER — FENTANYL CITRATE (PF) 100 MCG/2ML IJ SOLN
INTRAMUSCULAR | Status: DC | PRN
  Administered 2021-10-02 (×2): 50 ug via INTRAVENOUS

## 2021-10-02 MED ORDER — SIMETHICONE 80 MG PO CHEW: 40.0000 mg | CHEWABLE_TABLET | Freq: Four times a day (QID) | ORAL | Status: DC | PRN

## 2021-10-02 MED ORDER — PROPOFOL 10 MG/ML IV BOLUS
INTRAVENOUS | Status: DC | PRN
  Administered 2021-10-02: 160 mg via INTRAVENOUS

## 2021-10-02 MED ORDER — ONDANSETRON HCL 4 MG/2ML IJ SOLN: 4.0000 mg | Freq: Four times a day (QID) | INTRAMUSCULAR | Status: DC | PRN

## 2021-10-02 MED ORDER — ONDANSETRON 4 MG PO TBDP: 4.0000 mg | ORAL_TABLET | Freq: Four times a day (QID) | ORAL | Status: DC | PRN

## 2021-10-02 MED ORDER — ROCURONIUM BROMIDE 10 MG/ML (PF) SYRINGE
PREFILLED_SYRINGE | INTRAVENOUS | Status: DC | PRN
  Administered 2021-10-02: 50 mg via INTRAVENOUS
  Administered 2021-10-02: 20 mg via INTRAVENOUS

## 2021-10-02 MED ORDER — MORPHINE SULFATE (PF) 2 MG/ML IV SOLN
2.0000 mg | INTRAVENOUS | Status: DC | PRN
  Administered 2021-10-03: 04:00:00 2 mg via INTRAVENOUS
  Filled 2021-10-02: qty 1

## 2021-10-02 MED ORDER — EPHEDRINE SULFATE-NACL 50-0.9 MG/10ML-% IV SOSY
PREFILLED_SYRINGE | INTRAVENOUS | Status: DC | PRN
Start: 2021-10-02 — End: 2021-10-02
  Administered 2021-10-02: 10 mg via INTRAVENOUS

## 2021-10-02 MED ORDER — DIPHENHYDRAMINE HCL 50 MG/ML IJ SOLN: 12.5000 mg | Freq: Four times a day (QID) | INTRAMUSCULAR | Status: DC | PRN

## 2021-10-02 MED ORDER — DIPHENHYDRAMINE HCL 12.5 MG/5ML PO ELIX: 12.5000 mg | ORAL_SOLUTION | Freq: Four times a day (QID) | ORAL | Status: DC | PRN

## 2021-10-02 MED ORDER — ONDANSETRON HCL 4 MG/2ML IJ SOLN
INTRAMUSCULAR | Status: DC | PRN
  Administered 2021-10-02: 4 mg via INTRAVENOUS

## 2021-10-02 MED ORDER — LACTATED RINGERS IV SOLN: INTRAVENOUS | Status: DC

## 2021-10-02 MED ORDER — BUPIVACAINE-EPINEPHRINE 0.25% -1:200000 IJ SOLN
INTRAMUSCULAR | Status: AC
  Filled 2021-10-02: qty 1

## 2021-10-02 MED ORDER — LACTATED RINGERS IV BOLUS
500.0000 mL | Freq: Once | INTRAVENOUS | Status: AC
  Administered 2021-10-02: 18:00:00 500 mL via INTRAVENOUS

## 2021-10-02 MED ORDER — SUCCINYLCHOLINE CHLORIDE 200 MG/10ML IV SOSY
PREFILLED_SYRINGE | INTRAVENOUS | Status: DC | PRN
  Administered 2021-10-02: 14 mg via INTRAVENOUS

## 2021-10-02 MED ORDER — CEFAZOLIN SODIUM-DEXTROSE 2-4 GM/100ML-% IV SOLN
INTRAVENOUS | Status: AC
  Filled 2021-10-02: qty 100

## 2021-10-02 MED ORDER — ACETAMINOPHEN 500 MG PO TABS
1000.0000 mg | ORAL_TABLET | Freq: Four times a day (QID) | ORAL | Status: DC
  Administered 2021-10-03 – 2021-10-04 (×5): 1000 mg via ORAL
  Filled 2021-10-02 (×5): qty 2

## 2021-10-02 MED ORDER — PROPOFOL 10 MG/ML IV BOLUS
INTRAVENOUS | Status: AC
  Filled 2021-10-02: qty 20

## 2021-10-02 MED ORDER — SUGAMMADEX SODIUM 200 MG/2ML IV SOLN
INTRAVENOUS | Status: DC | PRN
  Administered 2021-10-02: 200 mg via INTRAVENOUS

## 2021-10-02 MED ORDER — CEFAZOLIN SODIUM-DEXTROSE 2-4 GM/100ML-% IV SOLN
2.0000 g | INTRAVENOUS | Status: AC
  Administered 2021-10-02: 20:00:00 2 g via INTRAVENOUS
  Filled 2021-10-02: qty 100

## 2021-10-02 MED ORDER — SENNA 8.6 MG PO TABS
1.0000 | ORAL_TABLET | Freq: Two times a day (BID) | ORAL | Status: DC
  Administered 2021-10-03 – 2021-10-05 (×5): 8.6 mg via ORAL
  Filled 2021-10-02 (×5): qty 1

## 2021-10-02 MED ORDER — FENTANYL CITRATE PF 50 MCG/ML IJ SOSY
25.0000 ug | PREFILLED_SYRINGE | Freq: Once | INTRAMUSCULAR | Status: AC
  Administered 2021-10-02: 19:00:00 25 ug via INTRAVENOUS
  Filled 2021-10-02: qty 1

## 2021-10-02 MED ORDER — DEXMEDETOMIDINE (PRECEDEX) IN NS 20 MCG/5ML (4 MCG/ML) IV SYRINGE
PREFILLED_SYRINGE | INTRAVENOUS | Status: AC
  Filled 2021-10-02: qty 10

## 2021-10-02 MED ORDER — LACTATED RINGERS IV BOLUS: 500.0000 mL | Freq: Once | INTRAVENOUS | Status: DC

## 2021-10-02 MED ORDER — DEXAMETHASONE SODIUM PHOSPHATE 10 MG/ML IJ SOLN
INTRAMUSCULAR | Status: DC | PRN
  Administered 2021-10-02: 10 mg via INTRAVENOUS

## 2021-10-02 MED ORDER — SODIUM CHLORIDE 0.9 % IR SOLN
Status: DC | PRN
  Administered 2021-10-02: 1000 mL

## 2021-10-02 MED ORDER — LACTATED RINGERS IV SOLN
INTRAVENOUS | Status: DC | PRN
Start: 2021-10-02 — End: 2021-10-02

## 2021-10-02 SURGICAL SUPPLY — 34 items
ADH SKN CLS APL DERMABOND .7 (GAUZE/BANDAGES/DRESSINGS) ×1
APL PRP STRL LF DISP 70% ISPRP (MISCELLANEOUS) ×1
BAG COUNTER SPONGE SURGICOUNT (BAG) IMPLANT
BAG SPNG CNTER NS LX DISP (BAG)
CHLORAPREP W/TINT 26 (MISCELLANEOUS) ×3 IMPLANT
DECANTER SPIKE VIAL GLASS SM (MISCELLANEOUS) IMPLANT
DERMABOND ADVANCED (GAUZE/BANDAGES/DRESSINGS) ×1
DERMABOND ADVANCED .7 DNX12 (GAUZE/BANDAGES/DRESSINGS) ×2 IMPLANT
DRAIN PENROSE 0.5X18 (DRAIN) ×3 IMPLANT
DRAPE LAPAROTOMY TRNSV 102X78 (DRAPES) ×3 IMPLANT
DRAPE UTILITY XL STRL (DRAPES) ×3 IMPLANT
ELECT REM PT RETURN 15FT ADLT (MISCELLANEOUS) ×3 IMPLANT
GAUZE 4X4 16PLY ~~LOC~~+RFID DBL (SPONGE) ×1 IMPLANT
GLOVE SRG 8 PF TXTR STRL LF DI (GLOVE) IMPLANT
GLOVE SURG ENC MOIS LTX SZ6.5 (GLOVE) ×3 IMPLANT
GLOVE SURG ENC MOIS LTX SZ8 (GLOVE) ×1 IMPLANT
GLOVE SURG UNDER LTX SZ6.5 (GLOVE) ×3 IMPLANT
GLOVE SURG UNDER POLY LF SZ7 (GLOVE) ×3 IMPLANT
GLOVE SURG UNDER POLY LF SZ8 (GLOVE) ×2
GOWN STRL REUS W/TWL XL LVL3 (GOWN DISPOSABLE) ×6 IMPLANT
KIT BASIN OR (CUSTOM PROCEDURE TRAY) ×3 IMPLANT
KIT TURNOVER KIT A (KITS) IMPLANT
MARKER SKIN DUAL TIP RULER LAB (MISCELLANEOUS) ×3 IMPLANT
NEEDLE HYPO 22GX1.5 SAFETY (NEEDLE) ×3 IMPLANT
PACK GENERAL/GYN (CUSTOM PROCEDURE TRAY) ×3 IMPLANT
SUT PROLENE 2 0 CT2 30 (SUTURE) ×2 IMPLANT
SUT VIC AB 2-0 SH 18 (SUTURE) ×3 IMPLANT
SUT VIC AB 3-0 SH 18 (SUTURE) ×3 IMPLANT
SUT VIC AB 4-0 PS2 27 (SUTURE) ×3 IMPLANT
SYR CONTROL 10ML LL (SYRINGE) ×3 IMPLANT
TOWEL OR 17X26 10 PK STRL BLUE (TOWEL DISPOSABLE) ×3 IMPLANT
TOWEL OR NON WOVEN STRL DISP B (DISPOSABLE) ×3 IMPLANT
TRAY FOL W/BAG SLVR 16FR STRL (SET/KITS/TRAYS/PACK) IMPLANT
TRAY FOLEY W/BAG SLVR 16FR LF (SET/KITS/TRAYS/PACK) ×2

## 2021-10-02 NOTE — Progress Notes (Signed)
Removed NGT per radiology report and replaced to Rt nostril. Air patent, another KUB done

## 2021-10-02 NOTE — Transfer of Care (Signed)
Immediate Anesthesia Transfer of Care Note  Patient: Cameron Barton  Procedure(s) Performed: Procedure(s): HERNIA REPAIR INGUINAL INCARCERATED (Left)  Patient Location: PACU  Anesthesia Type:General  Level of Consciousness: Alert, Awake, Oriented  Airway & Oxygen Therapy: Patient Spontanous Breathing  Post-op Assessment: Report given to RN  Post vital signs: Reviewed and stable  Last Vitals:  Vitals:   10/02/21 1929 10/02/21 1930  BP: (!) 172/110 (!) 180/108  Pulse: 77 79  Resp: 19 (!) 24  Temp: (!) 36 C   SpO2: 95% 94%    Complications: No apparent anesthesia complications

## 2021-10-02 NOTE — Progress Notes (Signed)
Patient arrived from PACU accompanied by RN. Patient with stable vitals and not appearing to be in any acute distress. Pt alert and oriented. NGT intact on arrival. Patient daughter notified. Will continue to assess this patient.

## 2021-10-02 NOTE — Op Note (Signed)
10/02/2021  9:01 PM  PATIENT:  Cameron Barton  87 y.o. male  Patient Care Team: System, Provider Not In as PCP - General  PRE-OPERATIVE DIAGNOSIS:  left incarcerated inguinal hernia  POST-OPERATIVE DIAGNOSIS:  left incarcerated inguinal hernia  PROCEDURE:  PRIMARY LEFT INGUINAL HERNIA REPAIR, INCARCERATED   Surgeon(s): Romie Levee, MD Luretha Murphy, MD  ASSISTANT: Dr Daphine Deutscher   ANESTHESIA:   local and general  EBL:  Total I/O In: 1100 [I.V.:1000; IV Piggyback:100] Out: 165 [Urine:150; Blood:15]  DRAINS: none   SPECIMEN:  No Specimen  DISPOSITION OF SPECIMEN:  N/A  COUNTS:  YES  PLAN OF CARE: Admit for overnight observation  PATIENT DISPOSITION:  PACU - hemodynamically stable.  INDICATION: 87 year old male who presented to the emergency department with acute onset of left groin pain and nausea and vomiting.  CT scan shows small bowel obstruction with a loop of small bowel in a left inguinal hernia.  Emergent operation was indicated.  Risk and benefits were explained to the patient prior to the procedure and consent was obtained and placed on chart.   OR FINDINGS: Large direct inguinal hernia with a smaller indirect component, incarcerated sigmoid colon without signs of vascular compromise, incarcerated loop of small bowel erythematous but not ischemic  DESCRIPTION: the patient was identified in the preoperative holding area and taken to the OR where they were laid \\supine  on the operating room table.  General anesthesia was induced without difficulty. SCDs were also noted to be in place prior to the initiation of anesthesia.  The patient was then prepped and draped in the usual sterile fashion.   A surgical timeout was performed indicating the correct patient, procedure, positioning and need for preoperative antibiotics.   I began by making an incision over the hernia in the left inguinal region.  This was carried down through Scarpa's fascia until the hernia sac  could be identified bulging up from the abdominal wall.  I bluntly encircled the hernia sac and was able to free this from the testicle and delivered into the wound.  I then identified the testicle and the testicular structures.  These were gently separated from the hernia sac and placed back into the scrotum.  The spermatic cord and testicular structures were preserved with a Penrose drain.  The hernia sac was then opened and the contents inspected.  There was a large loop of incarcerated sigmoid colon which appeared completely viable.  There was also a smaller loop of small bowel which was erythematous.  It did not appear to be ischemic or gangrenous.  This was then placed back into the abdomen.  The sigmoid colon was then gently teased back into the abdomen as well.  The hernia sac was then closed with a 2-0 Vicryl running suture and placed back into the abdomen.  This left a large direct defect.  There was a small indirect component that was also placed back into the abdomen.  We then separated out the pelvic floor structures.  Given the patient's age and the incarcerated small bowel it was elected not to proceed with mesh placement.  The conjoined tendon was easily sutured to the shelving of the inguinal ligament with interrupted 2-0 Prolene sutures until a defect of approximately 1 fingerbreadth was made medially.  I then closed the remaining external oblique fibers together using interrupted 2-0 Vicryl sutures.  Scarpa's fascia was closed using a running 2-0 Vicryl suture and the dermis was closed using a running 4-0 Vicryl suture.  Dermabond was  placed.  I&O cath was performed due to concern for postoperative urinary retention.  An NG was placed due to patient's bowel obstruction.  The patient was then awakened from anesthesia and sent to the postanesthesia care unit in stable condition.  All counts were correct per operating room staff.

## 2021-10-02 NOTE — ED Notes (Signed)
Pt transported to OR , report given to RN , sister given all belongings

## 2021-10-02 NOTE — Progress Notes (Signed)
Attempted to contact patient's next of kin twice after surgery by phone number provided with no answer.   Vanita Panda, MD  Colorectal and General Surgery [facility]

## 2021-10-02 NOTE — Progress Notes (Signed)
Connected to low intermittent suction, no sound noted  when push air to NGT, ordered KUBstat. Await to report from radiology. O2 sat dropped tp 88

## 2021-10-02 NOTE — ED Triage Notes (Signed)
Complains of abdominal pain and nausea, wife reports he was singing and acting fine at church. One episode of darker colored emesis in room.

## 2021-10-02 NOTE — Anesthesia Preprocedure Evaluation (Signed)
Anesthesia Evaluation  Patient identified by MRN, date of birth, ID band Patient awake    Reviewed: Allergy & Precautions, NPO status , Patient's Chart, lab work & pertinent test results  History of Anesthesia Complications Negative for: history of anesthetic complications  Airway Mallampati: I  TM Distance: >3 FB   Mouth opening: Pediatric Airway  Dental  (+) Edentulous Upper, Edentulous Lower, Dental Advisory Given   Pulmonary neg pulmonary ROS, former smoker,    Pulmonary exam normal        Cardiovascular hypertension, Normal cardiovascular exam     Neuro/Psych negative neurological ROS     GI/Hepatic negative GI ROS, Neg liver ROS,   Endo/Other  negative endocrine ROS  Renal/GU negative Renal ROS     Musculoskeletal  (+) Arthritis ,   Abdominal   Peds  Hematology negative hematology ROS (+)   Anesthesia Other Findings   Reproductive/Obstetrics                             Anesthesia Physical Anesthesia Plan  ASA: 3 and emergent  Anesthesia Plan: General   Post-op Pain Management:    Induction: Rapid sequence, Cricoid pressure planned and Intravenous  PONV Risk Score and Plan: 4 or greater and Ondansetron, Dexamethasone and Treatment may vary due to age or medical condition  Airway Management Planned: Oral ETT  Additional Equipment:   Intra-op Plan:   Post-operative Plan:   Informed Consent: I have reviewed the patients History and Physical, chart, labs and discussed the procedure including the risks, benefits and alternatives for the proposed anesthesia with the patient or authorized representative who has indicated his/her understanding and acceptance.     Dental advisory given  Plan Discussed with: CRNA and Anesthesiologist  Anesthesia Plan Comments:         Anesthesia Quick Evaluation

## 2021-10-02 NOTE — H&P (Signed)
CC: nausea, vomiting, abd pain  HPI: Cameron Barton is an 87 y.o. male who is here for abd pain, nausea and vomiting that started suddenly this afternoon.  He has had a known inguinal hernia for years.  Past Medical History:  Diagnosis Date   Arthritis     Past Surgical History:  Procedure Laterality Date   HERNIA REPAIR      No family history on file.  Social:  reports that he has quit smoking. He has never used smokeless tobacco. He reports that he does not drink alcohol and does not use drugs.  Allergies: No Known Allergies  Medications: I have reviewed the patient's current medications.  Results for orders placed or performed during the hospital encounter of 10/02/21 (from the past 48 hour(s))  CBC with Diff     Status: Abnormal   Collection Time: 10/02/21  5:30 PM  Result Value Ref Range   WBC 9.6 4.0 - 10.5 K/uL   RBC 6.43 (H) 4.22 - 5.81 MIL/uL   Hemoglobin 17.9 (H) 13.0 - 17.0 g/dL   HCT 54.8 (H) 39.0 - 52.0 %   MCV 85.2 80.0 - 100.0 fL   MCH 27.8 26.0 - 34.0 pg   MCHC 32.7 30.0 - 36.0 g/dL   RDW 14.7 11.5 - 15.5 %   Platelets 405 (H) 150 - 400 K/uL   nRBC 0.0 0.0 - 0.2 %   Neutrophils Relative % 81 %   Neutro Abs 7.7 1.7 - 7.7 K/uL   Lymphocytes Relative 12 %   Lymphs Abs 1.2 0.7 - 4.0 K/uL   Monocytes Relative 6 %   Monocytes Absolute 0.6 0.1 - 1.0 K/uL   Eosinophils Relative 0 %   Eosinophils Absolute 0.0 0.0 - 0.5 K/uL   Basophils Relative 0 %   Basophils Absolute 0.0 0.0 - 0.1 K/uL   Immature Granulocytes 1 %   Abs Immature Granulocytes 0.09 (H) 0.00 - 0.07 K/uL    Comment: Performed at [facility], [place] [address], [place], [place] [address]  Lactic acid, plasma     Status: Abnormal   Collection Time: 10/02/21  5:49 PM  Result Value Ref Range   Lactic Acid, Venous 3.1 (HH) 0.5 - 1.9 mmol/L    Comment: CRITICAL RESULT CALLED TO, READ BACK BY AND VERIFIED WITH: GROSE,CHAD 10/02/21 @1844  BY SEEL,M. Performed at [facility], [place] [address], [place], [place] [address]     CT ABDOMEN PELVIS WO CONTRAST  Result Date: 10/02/2021 CLINICAL DATA:  Nausea and abdominal pain. EXAM: CT ABDOMEN AND PELVIS WITHOUT CONTRAST TECHNIQUE: Multidetector CT imaging of the abdomen and pelvis was performed following the standard protocol without IV contrast. COMPARISON:  September 29, 2010 FINDINGS: Lower chest: No acute abnormality. Hepatobiliary: Tiny hepatic hypodensities are too small to accurately characterize but statistically likely to reflect a benign entity such as cysts or hemangiomas. Cholelithiasis without CT findings of acute cholecystitis. No biliary ductal dilation. Pancreas: No pancreatic ductal dilation or evidence of acute inflammation. Spleen: Within normal limits. Adrenals/Urinary Tract: Bilateral adrenal glands are unremarkable. No hydronephrosis. No renal, ureteral or bladder calculi identified. Urinary bladder is unremarkable for degree of distension. Stomach/Bowel: No enteric contrast was administered. Stomach is unremarkable for degree of distension. Prominent loops of small bowel in the left hemiabdomen with transition to decompressed bowel at a fat and bowel containing left inguinal hernia, within the left inguinal hernia sac there is dilated loops of small bowel measuring up to  3.1 cm with decompressed bowel at both efferent and afferent limbs of bowel. Additionally there is adjacent inflammatory fluid within the hernia sac and a portion of nondilated sigmoid colon. No pneumoperitoneum. Vascular/Lymphatic: Aortic and branch vessel atherosclerosis without abdominal aortic aneurysm. No pathologically enlarged abdominal or pelvic lymph nodes. Reproductive: Prostate is unremarkable. Other: No pneumoperitoneum. No significant abdominopelvic free fluid. Musculoskeletal: Advanced multilevel degenerative changes spine. No acute osseous abnormality. IMPRESSION: 1. Small-bowel obstruction with transition point at  a fat and bowel containing left inguinal hernia, with closed loop morphology of the obstructed loops of small bowel within the hernia sac. No pneumoperitoneum or pneumatosis. 2. Cholelithiasis without CT findings of acute cholecystitis. 3.  Aortic Atherosclerosis (ICD10-I70.0). Electronically Signed   By: Dahlia Bailiff M.D.   On: 10/02/2021 18:28    ROS - all of the below systems have been reviewed with the patient and positives are indicated with bold text General: chills, fever or night sweats Eyes: blurry vision or double vision ENT: epistaxis or sore throat Hematologic/Lymphatic: bleeding problems, blood clots or swollen lymph nodes Endocrine: temperature intolerance or unexpected weight changes Breast: new or changing breast lumps or nipple discharge Resp: cough, shortness of breath, or wheezing CV: chest pain or dyspnea on exertion GI: as per HPI GU: dysuria, trouble voiding, or hematuria Neuro: TIA or stroke symptoms    PE Blood pressure (!) 175/99, pulse 71, resp. rate (!) 26, SpO2 97 %. Constitutional: NAD; conversant; no deformities Eyes: Moist conjunctiva; no lid lag; anicteric; PERRL Neck: Trachea midline; no thyromegaly Lungs: Normal respiratory effort CV: RRR GI: Abd distended MSK: Normal range of motion of extremities; no clubbing/cyanosis Psychiatric: Appropriate affect; alert and oriented x3  Results for orders placed or performed during the hospital encounter of 10/02/21 (from the past 48 hour(s))  CBC with Diff     Status: Abnormal   Collection Time: 10/02/21  5:30 PM  Result Value Ref Range   WBC 9.6 4.0 - 10.5 K/uL   RBC 6.43 (H) 4.22 - 5.81 MIL/uL   Hemoglobin 17.9 (H) 13.0 - 17.0 g/dL   HCT 54.8 (H) 39.0 - 52.0 %   MCV 85.2 80.0 - 100.0 fL   MCH 27.8 26.0 - 34.0 pg   MCHC 32.7 30.0 - 36.0 g/dL   RDW 14.7 11.5 - 15.5 %   Platelets 405 (H) 150 - 400 K/uL   nRBC 0.0 0.0 - 0.2 %   Neutrophils Relative % 81 %   Neutro Abs 7.7 1.7 - 7.7 K/uL   Lymphocytes  Relative 12 %   Lymphs Abs 1.2 0.7 - 4.0 K/uL   Monocytes Relative 6 %   Monocytes Absolute 0.6 0.1 - 1.0 K/uL   Eosinophils Relative 0 %   Eosinophils Absolute 0.0 0.0 - 0.5 K/uL   Basophils Relative 0 %   Basophils Absolute 0.0 0.0 - 0.1 K/uL   Immature Granulocytes 1 %   Abs Immature Granulocytes 0.09 (H) 0.00 - 0.07 K/uL    Comment: Performed at [facility], [place] [address], [place], [place] [address]  Lactic acid, plasma     Status: Abnormal   Collection Time: 10/02/21  5:49 PM  Result Value Ref Range   Lactic Acid, Venous 3.1 (HH) 0.5 - 1.9 mmol/L    Comment: CRITICAL RESULT CALLED TO, READ BACK BY AND VERIFIED WITH: GROSE,CHAD 10/02/21 @1844  BY SEEL,M. Performed at [facility], [place] [address], [place], [place] [address]     CT ABDOMEN PELVIS WO CONTRAST  Result Date: 10/02/2021 CLINICAL DATA:  Nausea and abdominal pain. EXAM: CT ABDOMEN AND PELVIS WITHOUT CONTRAST TECHNIQUE: Multidetector CT imaging of the abdomen and pelvis was performed following the standard protocol without IV contrast. COMPARISON:  September 29, 2010 FINDINGS: Lower chest: No acute abnormality. Hepatobiliary: Tiny hepatic hypodensities are too small to accurately characterize but statistically likely to reflect a benign entity such as cysts or hemangiomas. Cholelithiasis without CT findings of acute cholecystitis. No biliary ductal dilation. Pancreas: No pancreatic ductal dilation or evidence of acute inflammation. Spleen: Within normal limits. Adrenals/Urinary Tract: Bilateral adrenal glands are unremarkable. No hydronephrosis. No renal, ureteral or bladder calculi identified. Urinary bladder is unremarkable for degree of distension. Stomach/Bowel: No enteric contrast was administered. Stomach is unremarkable for degree of distension. Prominent loops of small bowel in the left hemiabdomen with transition to decompressed bowel at a fat and bowel containing left inguinal  hernia, within the left inguinal hernia sac there is dilated loops of small bowel measuring up to 3.1 cm with decompressed bowel at both efferent and afferent limbs of bowel. Additionally there is adjacent inflammatory fluid within the hernia sac and a portion of nondilated sigmoid colon. No pneumoperitoneum. Vascular/Lymphatic: Aortic and branch vessel atherosclerosis without abdominal aortic aneurysm. No pathologically enlarged abdominal or pelvic lymph nodes. Reproductive: Prostate is unremarkable. Other: No pneumoperitoneum. No significant abdominopelvic free fluid. Musculoskeletal: Advanced multilevel degenerative changes spine. No acute osseous abnormality. IMPRESSION: 1. Small-bowel obstruction with transition point at a fat and bowel containing left inguinal hernia, with closed loop morphology of the obstructed loops of small bowel within the hernia sac. No pneumoperitoneum or pneumatosis. 2. Cholelithiasis without CT findings of acute cholecystitis. 3.  Aortic Atherosclerosis (ICD10-I70.0). Electronically Signed   By: Maudry Mayhew M.D.   On: 10/02/2021 18:28     A/P: Cameron Barton is an 87 y.o. male with an incarcerated left inguinal hernia causing SBO.  I have recommended hernia repair to resolve this.  We may not be able to place mesh.  If a primary hernia repair is attempted, there is a significant risk of recurrence.  If mesh is placed, there is an increased risk of mesh infection.  We will try to make the best decision for him in the OR.  Other risks include damage to adjacent structures, bowel or bladder injury, chronic pain or numbness in the groin area and need for additional procedures.  I believe he understands this and wishes to proceed.  All questions were answered.      Vanita Panda, MD  Colorectal and General Surgery [facility]

## 2021-10-02 NOTE — ED Provider Notes (Signed)
[facility] DEPT Provider Note   CSN: [id number] Arrival date & time: 10/02/21  1654     History Chief Complaint  Patient presents with   Emesis   Abdominal Pain    Cameron Barton is a 87 y.o. male.  HPI Patient is a 87 year old male who presents for abdominal pain, nausea, and vomiting.  Onset was this afternoon.  He was reportedly in his normal state of health this morning.  He went to church, he was participating in Sunday school.  When he got home from church, he had nausea, vomiting, and generalized weakness.  This has persisted and patient arrives actively vomiting.  He very rarely sees doctors.  He states that he has had an ongoing hernia in his scrotum.  He did experience some increased discomfort in this area this morning.  His last bowel movement was 2 days ago.  He does state that he was passing gas today.    Past Medical History:  Diagnosis Date   Arthritis     Patient Active Problem List   Diagnosis Date Noted   Inguinal hernia of left side with obstruction 10/02/2021   Hernia, inguinal 10/02/2021    Past Surgical History:  Procedure Laterality Date   HERNIA REPAIR         No family history on file.  Social History   Tobacco Use   Smoking status: Former   Smokeless tobacco: Never  Substance Use Topics   Alcohol use: No    Alcohol/week: 0.0 standard drinks   Drug use: No    Home Medications Prior to Admission medications   Medication Sig Start Date End Date Taking? Authorizing Provider  acetaminophen (TYLENOL) 500 MG tablet Take 500 mg by mouth every 6 (six) hours as needed for moderate pain.   Yes [provider]    Allergies    Patient has no known allergies.  Review of Systems   Review of Systems  Constitutional:  Negative for activity change, chills, fatigue and fever.  HENT:  Negative for ear pain and sore throat.   Eyes:  Negative for pain and visual disturbance.  Respiratory:  Negative for  cough, chest tightness and shortness of breath.   Cardiovascular:  Negative for chest pain and palpitations.  Gastrointestinal:  Positive for abdominal pain, nausea and vomiting.  Genitourinary:  Positive for scrotal swelling. Negative for dysuria, flank pain and hematuria.  Musculoskeletal:  Negative for arthralgias, back pain, myalgias and neck pain.  Skin:  Negative for color change and rash.  Neurological:  Positive for weakness (Generalized). Negative for seizures, numbness and headaches. Syncope: Near syncope. Hematological:  Does not bruise/bleed easily.  Psychiatric/Behavioral:  Negative for confusion and decreased concentration.   All other systems reviewed and are negative.  Physical Exam Updated Vital Signs BP (!) 144/92 (BP Location: Right Arm)    Pulse 73    Temp (!) 96.5 F (35.8 C)    Resp (!) 26    SpO2 94%   Physical Exam Vitals and nursing note reviewed. Exam conducted with a chaperone present.  Constitutional:      General: He is not in acute distress.    Appearance: He is well-developed. He is ill-appearing. He is not toxic-appearing or diaphoretic.  HENT:     Head: Normocephalic and atraumatic.     Mouth/Throat:     Mouth: Mucous membranes are moist.     Pharynx: Oropharynx is clear.  Eyes:     General: No scleral icterus.  Conjunctiva/sclera: Conjunctivae normal.     Pupils: Pupils are equal, round, and reactive to light.  Cardiovascular:     Rate and Rhythm: Normal rate and regular rhythm.     Heart sounds: No murmur heard. Pulmonary:     Effort: Pulmonary effort is normal. No respiratory distress.     Breath sounds: Normal breath sounds. No wheezing or rales.  Abdominal:     Palpations: Abdomen is soft.     Tenderness: There is no abdominal tenderness.  Genitourinary:    Comments: Large, firm scrotal swelling.  No overlying erythema.  Area is tender. Musculoskeletal:        General: No swelling.     Cervical back: Neck supple.  Skin:    General:  Skin is warm and dry.  Neurological:     General: No focal deficit present.     Mental Status: He is alert and oriented to person, place, and time.     Cranial Nerves: No cranial nerve deficit.     Motor: No weakness.  Psychiatric:        Mood and Affect: Mood normal.        Behavior: Behavior normal.    ED Results / Procedures / Treatments   Labs (all labs ordered are listed, but only abnormal results are displayed) Labs Reviewed  CBC WITH DIFFERENTIAL/PLATELET - Abnormal; Notable for the following components:      Result Value   RBC 6.43 (*)    Hemoglobin 17.9 (*)    HCT 54.8 (*)    Platelets 405 (*)    Abs Immature Granulocytes 0.09 (*)    All other components within normal limits  MAGNESIUM - Abnormal; Notable for the following components:   Magnesium 2.7 (*)    All other components within normal limits  LACTIC ACID, PLASMA - Abnormal; Notable for the following components:   Lactic Acid, Venous 3.1 (*)    All other components within normal limits  BASIC METABOLIC PANEL - Abnormal; Notable for the following components:   CO2 20 (*)    All other components within normal limits  RESP PANEL BY RT-PCR (FLU A&B, COVID) ARPGX2  LIPASE, BLOOD  COMPREHENSIVE METABOLIC PANEL  TROPONIN I (HIGH SENSITIVITY)    EKG EKG Interpretation  Date/Time:  Sunday October 02 2021 18:16:56 EST Ventricular Rate:  72 PR Interval:  172 QRS Duration: 98 QT Interval:  426 QTC Calculation: 467 R Axis:   31 Text Interpretation: Sinus arrhythmia Ventricular premature complex Prominent P waves, nondiagnostic Confirmed by Gloris Manchester [phone]) on 10/02/2021 6:18:17 PM  Radiology CT ABDOMEN PELVIS WO CONTRAST  Result Date: 10/02/2021 CLINICAL DATA:  Nausea and abdominal pain. EXAM: CT ABDOMEN AND PELVIS WITHOUT CONTRAST TECHNIQUE: Multidetector CT imaging of the abdomen and pelvis was performed following the standard protocol without IV contrast. COMPARISON:  September 29, 2010 FINDINGS: Lower  chest: No acute abnormality. Hepatobiliary: Tiny hepatic hypodensities are too small to accurately characterize but statistically likely to reflect a benign entity such as cysts or hemangiomas. Cholelithiasis without CT findings of acute cholecystitis. No biliary ductal dilation. Pancreas: No pancreatic ductal dilation or evidence of acute inflammation. Spleen: Within normal limits. Adrenals/Urinary Tract: Bilateral adrenal glands are unremarkable. No hydronephrosis. No renal, ureteral or bladder calculi identified. Urinary bladder is unremarkable for degree of distension. Stomach/Bowel: No enteric contrast was administered. Stomach is unremarkable for degree of distension. Prominent loops of small bowel in the left hemiabdomen with transition to decompressed bowel at a fat and bowel containing left  inguinal hernia, within the left inguinal hernia sac there is dilated loops of small bowel measuring up to 3.1 cm with decompressed bowel at both efferent and afferent limbs of bowel. Additionally there is adjacent inflammatory fluid within the hernia sac and a portion of nondilated sigmoid colon. No pneumoperitoneum. Vascular/Lymphatic: Aortic and branch vessel atherosclerosis without abdominal aortic aneurysm. No pathologically enlarged abdominal or pelvic lymph nodes. Reproductive: Prostate is unremarkable. Other: No pneumoperitoneum. No significant abdominopelvic free fluid. Musculoskeletal: Advanced multilevel degenerative changes spine. No acute osseous abnormality. IMPRESSION: 1. Small-bowel obstruction with transition point at a fat and bowel containing left inguinal hernia, with closed loop morphology of the obstructed loops of small bowel within the hernia sac. No pneumoperitoneum or pneumatosis. 2. Cholelithiasis without CT findings of acute cholecystitis. 3.  Aortic Atherosclerosis (ICD10-I70.0). Electronically Signed   By: Dahlia Bailiff M.D.   On: 10/02/2021 18:28   DG Abd 1 View  Result Date:  10/02/2021 CLINICAL DATA:  Nasogastric tube placement. EXAM: ABDOMEN - 1 VIEW COMPARISON:  Same day abdominal radiograph at 2140 hours FINDINGS: Nasogastric tube with tip and side port overlying the stomach. IMPRESSION: Nasogastric tube with tip and side port overlying the stomach. Electronically Signed   By: Dahlia Bailiff M.D.   On: 10/02/2021 22:28   DG Abd 1 View  Result Date: 10/02/2021 CLINICAL DATA:  Check gastric catheter placement EXAM: ABDOMEN - 1 VIEW COMPARISON:  CT from earlier in the same day. FINDINGS: Gastric catheter is noted coiled within the oropharynx and hypopharynx. This should be withdrawn completely and reinserted. Increasing density is noted in the right upper lobe consistent with right upper lobe consolidation. IMPRESSION: Gastric catheter coiled within the oropharynx and hypopharynx. This should be withdrawn totally and readvanced. Electronically Signed   By: Inez Catalina M.D.   On: 10/02/2021 21:57    Procedures Procedures   Medications Ordered in ED Medications  bupivacaine-EPINEPHrine (MARCAINE W/ EPI) 0.25% -1:200000 (with pres) injection (50 mLs Infiltration Given 10/02/21 2012)  sodium chloride irrigation 0.9 % (1,000 mLs Irrigation Given 10/02/21 2012)  lactated ringers bolus 500 mL (500 mLs Intravenous New Bag/Given 10/02/21 1749)  ondansetron (ZOFRAN) injection 4 mg (4 mg Intravenous Given 10/02/21 1749)  fentaNYL (SUBLIMAZE) injection 25 mcg (25 mcg Intravenous Given 10/02/21 1749)  fentaNYL (SUBLIMAZE) injection 25 mcg (25 mcg Intravenous Given 10/02/21 1846)  ceFAZolin (ANCEF) IVPB 2g/100 mL premix (2 g Intravenous Given 10/02/21 1946)  ceFAZolin (ANCEF) 2-4 GM/100ML-% IVPB (  Override pull for Anesthesia 10/02/21 2002)    ED Course  I have reviewed the triage vital signs and the nursing notes.  Pertinent labs & imaging results that were available during my care of the patient were reviewed by me and considered in my medical decision making (see chart  for details).    MDM Rules/Calculators/A&P                         Functional 87 year old male presents for abdominal pain, nausea, and vomiting.  He also endorses increased scrotal discomfort starting this morning.  He has had an ongoing hernia that has been present in the scrotum.  He has not been seeing doctors for this.  Patient is actively vomiting upon arrival.  He is alert and oriented.  Vomitus appears to be gastric contents.  He does have a large, firm, tender swelling throughout his scrotum.  Medications were given for analgesia and nausea.  Labs and CT scan were ordered.  CT scan shows  concern for incarcerated inguinal hernia with associated bowel obstruction.  General surgery was consulted.  On reassessment, patient had continued pain.  Additional fentanyl was given.  CBC consistent with hemoconcentration.  BMP showed slight metabolic acidosis.  Initial lactate was 3.1.  Patient was transported to the OR for operative management.   Final Clinical Impression(s) / ED Diagnoses Final diagnoses:  Incarcerated inguinal hernia  Nausea and vomiting, unspecified vomiting type    Rx / DC Orders ED Discharge Orders     None        Godfrey Pick, MD 10/02/21 2240

## 2021-10-02 NOTE — Anesthesia Procedure Notes (Signed)
Procedure Name: Intubation Date/Time: 10/02/2021 7:53 PM Performed by: Gerald Leitz, CRNA Pre-anesthesia Checklist: Patient identified, Patient being monitored, Timeout performed, Emergency Drugs available and Suction available Patient Re-evaluated:Patient Re-evaluated prior to induction Oxygen Delivery Method: Circle system utilized Preoxygenation: Pre-oxygenation with 100% oxygen Induction Type: IV induction and Rapid sequence Ventilation: Mask ventilation without difficulty Laryngoscope Size: Mac and 3 Grade View: Grade I Tube type: Oral Tube size: 7.5 mm Number of attempts: 1 Airway Equipment and Method: Stylet Placement Confirmation: ETT inserted through vocal cords under direct vision, positive ETCO2 and breath sounds checked- equal and bilateral Secured at: 23 cm Tube secured with: Tape Dental Injury: Teeth and Oropharynx as per pre-operative assessment

## 2021-10-03 DIAGNOSIS — K4040 Unilateral inguinal hernia, with gangrene, not specified as recurrent: Secondary | ICD-10-CM | POA: Diagnosis present

## 2021-10-03 DIAGNOSIS — Z87891 Personal history of nicotine dependence: Secondary | ICD-10-CM | POA: Diagnosis not present

## 2021-10-03 DIAGNOSIS — Z20822 Contact with and (suspected) exposure to covid-19: Secondary | ICD-10-CM | POA: Diagnosis present

## 2021-10-03 DIAGNOSIS — R4781 Slurred speech: Secondary | ICD-10-CM | POA: Diagnosis present

## 2021-10-03 DIAGNOSIS — M1990 Unspecified osteoarthritis, unspecified site: Secondary | ICD-10-CM | POA: Diagnosis present

## 2021-10-03 DIAGNOSIS — K4030 Unilateral inguinal hernia, with obstruction, without gangrene, not specified as recurrent: Secondary | ICD-10-CM | POA: Diagnosis present

## 2021-10-03 LAB — COMPREHENSIVE METABOLIC PANEL
ALT: 14 U/L (ref 0–44)
AST: 20 U/L (ref 15–41)
Albumin: 4.3 g/dL (ref 3.5–5.0)
Alkaline Phosphatase: 43 U/L (ref 38–126)
Anion gap: 5 (ref 5–15)
BUN: 18 mg/dL (ref 8–23)
CO2: 28 mmol/L (ref 22–32)
Calcium: 9.1 mg/dL (ref 8.9–10.3)
Chloride: 105 mmol/L (ref 98–111)
Creatinine, Ser: 0.84 mg/dL (ref 0.61–1.24)
GFR, Estimated: >60 mL/min (ref >60)
Glucose, Bld: 146 mg/dL — ABNORMAL HIGH (ref 70–99)
Potassium: 3.5 mmol/L (ref 3.5–5.1)
Sodium: 138 mmol/L (ref 135–145)
Total Bilirubin: 0.9 mg/dL (ref 0.3–1.2)
Total Protein: 7.2 g/dL (ref 6.5–8.1)

## 2021-10-03 NOTE — Progress Notes (Signed)
Transition of Care [facility]) Screening Note  Patient Details  Name: TOBECHUKWU EMMICK Date of Birth: 1929-08-19  Transition of Care [facility]) CM/SW Contact:    Ewing Schlein, LCSW Phone Number: 10/03/2021, 10:41 AM  Transition of Care Department [facility]) has reviewed patient and no TOC needs have been identified at this time. We will continue to monitor patient advancement through interdisciplinary progression rounds. If new patient transition needs arise, please place a TOC consult.

## 2021-10-03 NOTE — Progress Notes (Signed)
Mobility Specialist - Progress Note    10/03/21 1029  Mobility  Activity Ambulated in room;Transferred:  Bed to chair  Level of Assistance Contact guard assist, steadying assist  Assistive Device Front wheel walker  Distance Ambulated (ft) 5 ft  Mobility Sit up in bed/chair position for meals  Mobility Response Tolerated well  Mobility performed by Mobility specialist  $Mobility charge 1 Mobility   Upon entry pt refused to ambulate in hallway, but was willing to move to recliner. Pt required RW and contact guard to ambulate 5 ft in room prior to landing in chair. Verbal cues required to help steady him in the chair. Pt left sitting up with chair alarm on and call bell at side.   Arliss Journey Mobility Specialist Acute Rehabilitation Services Phone: [phone] 10/03/21, 10:31 AM

## 2021-10-03 NOTE — Progress Notes (Signed)
NGT tube removed by patient. MD notified and notes okay to leave NGT out at this time. Will continue to assess patient.

## 2021-10-03 NOTE — Progress Notes (Signed)
1 Day Post-Op   Subjective/Chief Complaint: Patient reports minimal pain at surgical site He removed his own NG tube last night - no nausea or vomiting recorded Passing flatus    Objective: Vital signs in last 24 hours: Temp:  [96.5 F (35.8 C)-99.2 F (37.3 C)] 98.3 F (36.8 C) (12/26 0913) Pulse Rate:  [71-89] 88 (12/26 0913) Resp:  [15-26] 16 (12/26 0913) BP: (126-180)/(71-110) 126/71 (12/26 0913) SpO2:  [87 %-99 %] 94 % (12/26 0913)    Intake/Output from previous day: 12/25 0701 - 12/26 0700 In: 1700 [I.V.:1600; IV Piggyback:100] Out: 515 [Urine:500; Blood:15] Intake/Output this shift: No intake/output data recorded.  General appearance: alert, cooperative, and no distress GI: soft, non-distended; minimal left groin tenderness Left inguinal incision c/d/I  Lab Results:  Recent Labs    10/02/21 1730  WBC 9.6  HGB 17.9*  HCT 54.8*  PLT 405*   BMET Recent Labs    10/02/21 1730 10/02/21 2304  NA 139 138  K 5.0 3.5  CL 104 105  CO2 20* 28  GLUCOSE 90 146*  BUN QUANTITY NOT SUFFICIENT FOR TESTING, PLEASE RESUBMIT SAMPLE. 18  CREATININE 0.99 0.84  CALCIUM 9.4 9.1   PT/INR No results for input(s): LABPROT, INR in the last 72 hours. ABG No results for input(s): PHART, HCO3 in the last 72 hours.  Invalid input(s): PCO2, PO2  Studies/Results: CT ABDOMEN PELVIS WO CONTRAST  Result Date: 10/02/2021 CLINICAL DATA:  Nausea and abdominal pain. EXAM: CT ABDOMEN AND PELVIS WITHOUT CONTRAST TECHNIQUE: Multidetector CT imaging of the abdomen and pelvis was performed following the standard protocol without IV contrast. COMPARISON:  September 29, 2010 FINDINGS: Lower chest: No acute abnormality. Hepatobiliary: Tiny hepatic hypodensities are too small to accurately characterize but statistically likely to reflect a benign entity such as cysts or hemangiomas. Cholelithiasis without CT findings of acute cholecystitis. No biliary ductal dilation. Pancreas: No pancreatic  ductal dilation or evidence of acute inflammation. Spleen: Within normal limits. Adrenals/Urinary Tract: Bilateral adrenal glands are unremarkable. No hydronephrosis. No renal, ureteral or bladder calculi identified. Urinary bladder is unremarkable for degree of distension. Stomach/Bowel: No enteric contrast was administered. Stomach is unremarkable for degree of distension. Prominent loops of small bowel in the left hemiabdomen with transition to decompressed bowel at a fat and bowel containing left inguinal hernia, within the left inguinal hernia sac there is dilated loops of small bowel measuring up to 3.1 cm with decompressed bowel at both efferent and afferent limbs of bowel. Additionally there is adjacent inflammatory fluid within the hernia sac and a portion of nondilated sigmoid colon. No pneumoperitoneum. Vascular/Lymphatic: Aortic and branch vessel atherosclerosis without abdominal aortic aneurysm. No pathologically enlarged abdominal or pelvic lymph nodes. Reproductive: Prostate is unremarkable. Other: No pneumoperitoneum. No significant abdominopelvic free fluid. Musculoskeletal: Advanced multilevel degenerative changes spine. No acute osseous abnormality. IMPRESSION: 1. Small-bowel obstruction with transition point at a fat and bowel containing left inguinal hernia, with closed loop morphology of the obstructed loops of small bowel within the hernia sac. No pneumoperitoneum or pneumatosis. 2. Cholelithiasis without CT findings of acute cholecystitis. 3.  Aortic Atherosclerosis (ICD10-I70.0). Electronically Signed   By: Maudry Mayhew M.D.   On: 10/02/2021 18:28   DG Abd 1 View  Result Date: 10/02/2021 CLINICAL DATA:  Nasogastric tube placement. EXAM: ABDOMEN - 1 VIEW COMPARISON:  Same day abdominal radiograph at 2140 hours FINDINGS: Nasogastric tube with tip and side port overlying the stomach. IMPRESSION: Nasogastric tube with tip and side port overlying the stomach. Electronically  Signed   By:  Maudry Mayhew M.D.   On: 10/02/2021 22:28   DG Abd 1 View  Result Date: 10/02/2021 CLINICAL DATA:  Check gastric catheter placement EXAM: ABDOMEN - 1 VIEW COMPARISON:  CT from earlier in the same day. FINDINGS: Gastric catheter is noted coiled within the oropharynx and hypopharynx. This should be withdrawn completely and reinserted. Increasing density is noted in the right upper lobe consistent with right upper lobe consolidation. IMPRESSION: Gastric catheter coiled within the oropharynx and hypopharynx. This should be withdrawn totally and readvanced. Electronically Signed   By: Alcide Clever M.D.   On: 10/02/2021 21:57    Anti-infectives: Anti-infectives (From admission, onward)    Start     Dose/Rate Route Frequency Ordered Stop   10/02/21 1935  ceFAZolin (ANCEF) 2-4 GM/100ML-% IVPB       Note to Pharmacy: Sherron Flemings: cabinet override      10/02/21 1935 10/02/21 2002   10/02/21 1930  ceFAZolin (ANCEF) IVPB 2g/100 mL premix        2 g 200 mL/hr over 30 Minutes Intravenous On call to O.R. 10/02/21 1926 10/02/21 2016       Assessment/Plan: s/p Procedure(s): HERNIA REPAIR INGUINAL INCARCERATED (Left) Clear liquids Await return of bowel function PT for ambulation    LOS: 0 days    Wynona Luna 10/03/2021

## 2021-10-03 NOTE — Evaluation (Signed)
Physical Therapy Evaluation Patient Details Name: Cameron Barton MRN: [id number] DOB: 10/13/1928 Today's Date: 10/03/2021  History of Present Illness  87yo male who presented on 12/25 with abdominal pain, nausea, and vomiting; found to have incarcerated inguinal hernia (chronic) which is now causing SBO. Received L incarcerated hernia repair on 12/25. No significant PMH/PSH  Clinical Impression  Pt received in bed, daughter present and very supportive/helpful in encouraging him to participate today. Able to generally mobilize with Min guard-MinA with RW, tolerated walking a short distance in the hallway but also easily fatigued. Needed up to MinA for RW management when walking longer distances, as well as for balance when first standing and for turning in the hallway.   Left up in recliner with all needs met, chair alarm active and daughter present. Will continue to follow.      Recommendations for follow up therapy are one component of a multi-disciplinary discharge planning process, led by the attending physician.  Recommendations may be updated based on patient status, additional functional criteria and insurance authorization.  Follow Up Recommendations Home health PT    Assistance Recommended at Discharge Frequent or constant Supervision/Assistance  Functional Status Assessment Patient has had a recent decline in their functional status and demonstrates the ability to make significant improvements in function in a reasonable and predictable amount of time.  Equipment Recommendations  Rolling walker (2 wheels);BSC/3in1    Recommendations for Other Services       Precautions / Restrictions Precautions Precautions: Fall;Other (comment) Precaution Comments: abdominal incision Restrictions Weight Bearing Restrictions: No      Mobility  Bed Mobility Overal bed mobility: Needs Assistance Bed Mobility: Rolling;Sidelying to Sit Rolling: Supervision Sidelying to sit: Supervision        General bed mobility comments: S in general for all bed mobility, HOB slightly elevated, increased time. Benefits from step by step cues for sequencing    Transfers Overall transfer level: Needs assistance Equipment used: Rolling walker (2 wheels) Transfers: Sit to/from Stand Sit to Stand: Min assist           General transfer comment: min guard for safety, cues for sequencing and MinA to gain balance due to posterior LOB initially    Ambulation/Gait Ambulation/Gait assistance: Min assist Gait Distance (Feet): 120 Feet Assistive device: Rolling walker (2 wheels) Gait Pattern/deviations: Step-through pattern;Decreased step length - right;Decreased step length - left;Drifts right/left;Trunk flexed;Wide base of support Gait velocity: decreased     General Gait Details: slow and mildly unsteady with RW, did need up to MinA for device management and reminders to keep it close  Stairs            Wheelchair Mobility    Modified Rankin (Stroke Patients Only)       Balance Overall balance assessment: Needs assistance Sitting-balance support: Feet supported;Bilateral upper extremity supported Sitting balance-Leahy Scale: Good     Standing balance support: Bilateral upper extremity supported;During functional activity;Reliant on assistive device for balance Standing balance-Leahy Scale: Fair                               Pertinent Vitals/Pain Pain Assessment: No/denies pain    Home Living Family/patient expects to be discharged to:: Private residence Living Arrangements: Children;Other (Comment) (will be going to live with daughter in [place], not sure of exact set up of home) Available Help at Discharge: Family;Available 24 hours/day Type of Home: House  Home Equipment: None Additional Comments: plans to stay with daughter in [place] after surgery, not sure of exact set up of the home and this daughter was not present to clarify     Prior Function Prior Level of Function : Independent/Modified Independent                     Hand Dominance        Extremity/Trunk Assessment   Upper Extremity Assessment Upper Extremity Assessment: Defer to OT evaluation    Lower Extremity Assessment Lower Extremity Assessment: Generalized weakness    Cervical / Trunk Assessment Cervical / Trunk Assessment: Kyphotic  Communication   Communication: No difficulties  Cognition Arousal/Alertness: Awake/alert Behavior During Therapy: WFL for tasks assessed/performed;Flat affect Overall Cognitive Status: Within Functional Limits for tasks assessed                                 General Comments: follows cues well- question if there are some agre related mild cognitive deficits? Nothing in H&P. Followed cues well. Family very helpful in convincing him to participate today.        General Comments      Exercises     Assessment/Plan    PT Assessment Patient needs continued PT services  PT Problem List Decreased strength;Decreased cognition;Decreased knowledge of use of DME;Decreased activity tolerance;Decreased safety awareness;Decreased balance;Decreased mobility       PT Treatment Interventions DME instruction;Balance training;Gait training;Stair training;Functional mobility training;Patient/family education;Therapeutic activities;Therapeutic exercise    PT Goals (Current goals can be found in the Care Plan section)  Acute Rehab PT Goals Patient Stated Goal: go stay with daughter in [place] while he recovers PT Goal Formulation: With patient/family Time For Goal Achievement: 10/17/21 Potential to Achieve Goals: Good    Frequency Min 3X/week   Barriers to discharge        Co-evaluation               AM-PAC PT "6 Clicks" Mobility  Outcome Measure Help needed turning from your back to your side while in a flat bed without using bedrails?: A Little Help needed moving from lying on  your back to sitting on the side of a flat bed without using bedrails?: A Little Help needed moving to and from a bed to a chair (including a wheelchair)?: A Little Help needed standing up from a chair using your arms (e.g., wheelchair or bedside chair)?: A Little Help needed to walk in hospital room?: A Little Help needed climbing 3-5 steps with a railing? : A Lot 6 Click Score: 17    End of Session Equipment Utilized During Treatment: Gait belt Activity Tolerance: Patient tolerated treatment well Patient left: in chair;with call bell/phone within reach;with chair alarm set;with family/visitor present   PT Visit Diagnosis: Unsteadiness on feet (R26.81);Difficulty in walking, not elsewhere classified (R26.2);Muscle weakness (generalized) (M62.81)    Time: 2233-6122 PT Time Calculation (min) (ACUTE ONLY): 19 min   Charges:   PT Evaluation $PT Eval Low Complexity: 1 Low        Windell Norfolk, DPT, PN2   Supplemental Physical Therapist [place]    Pager [phone] Acute Rehab Office [phone]

## 2021-10-04 ENCOUNTER — Encounter (HOSPITAL_COMMUNITY): Payer: Self-pay | Admitting: General Surgery

## 2021-10-04 ENCOUNTER — Inpatient Hospital Stay (HOSPITAL_COMMUNITY): Payer: Medicare Other

## 2021-10-04 MED ORDER — POLYETHYLENE GLYCOL 3350 17 G PO PACK
17.0000 g | PACK | Freq: Every day | ORAL | Status: DC
  Administered 2021-10-04 – 2021-10-05 (×2): 17 g via ORAL
  Filled 2021-10-04 (×2): qty 1

## 2021-10-04 NOTE — Anesthesia Postprocedure Evaluation (Signed)
Anesthesia Post Note  Patient: Cameron Barton  Procedure(s) Performed: HERNIA REPAIR INGUINAL INCARCERATED (Left)     Patient location during evaluation: PACU Anesthesia Type: General Level of consciousness: sedated Pain management: pain level controlled Vital Signs Assessment: post-procedure vital signs reviewed and stable Respiratory status: spontaneous breathing and respiratory function stable Cardiovascular status: stable Postop Assessment: no apparent nausea or vomiting Anesthetic complications: no   No notable events documented.                Jaquese Irving DANIEL

## 2021-10-04 NOTE — Progress Notes (Signed)
Initial Nutrition Assessment  INTERVENTION:   -Needs updated weight (last recorded from 2018)  -Encourage continued good intakes  NUTRITION DIAGNOSIS:   Inadequate oral intake related to nausea, vomiting as evidenced by per patient/family report.  GOAL:   Patient will meet greater than or equal to 90% of their needs  MONITOR:   PO intake, Labs, Weight trends, I & O's  REASON FOR ASSESSMENT:   Malnutrition Screening Tool    ASSESSMENT:   87 y.o. male who is here for abd pain, nausea and vomiting . Admitted for incarcerated left inguinal hernia causing SBO.  12/25: s/p PRIMARY LEFT INGUINAL HERNIA REPAIR  Patient in room with family at bedside. Pt reports eating a good breakfast of eggs, bacon, grits. Pt states he has always had a good appetite. Family endorses this.  Per chart review, pt was having N/V right before coming to hospital. No supplements warranted at this time. Pt denies issues with chewing or swallowing.   Per pt, his UBW is >200 lbs. States he thinks he weighs around 180 lbs now. No recorded weights since 2018. Will need a new weight to confirm weight changes.   Medications: Miralax, Senokot  Labs reviewed.  NUTRITION - FOCUSED PHYSICAL EXAM:  Flowsheet Row Most Recent Value  Orbital Region Mild depletion  Upper Arm Region Mild depletion  Thoracic and Lumbar Region No depletion  Buccal Region No depletion  Temple Region No depletion  Clavicle Bone Region No depletion  Clavicle and Acromion Bone Region No depletion  Scapular Bone Region No depletion  Dorsal Hand No depletion  Patellar Region No depletion  Anterior Thigh Region No depletion  Posterior Calf Region No depletion  Edema (RD Assessment) None       Diet Order:   Diet Order             DIET SOFT Room service appropriate? Yes; Fluid consistency: Thin  Diet effective now                   EDUCATION NEEDS:   No education needs have been identified at this time  Skin:   Skin Assessment: Skin Integrity Issues: Skin Integrity Issues:: Incisions Incisions: left groin  Last BM:  PTA  Height:   Ht Readings from Last 1 Encounters:  01/03/17 5\' 8"  (1.727 m)    Weight:   Wt Readings from Last 1 Encounters:  01/03/17 97.5 kg    BMI:  There is no height or weight on file to calculate BMI.  Estimated Nutritional Needs:   Kcal:  1600-1800 -using IBW  Protein:  75-85g -using IBW  Fluid:  1.8L/day   01/05/17, MS, RD, LDN Inpatient Clinical Dietitian Contact information available via Amion

## 2021-10-04 NOTE — Progress Notes (Signed)
Physical Therapy Treatment Patient Details Name: Cameron Barton MRN: [id number] DOB: 1929-08-22 Today's Date: 10/04/2021   History of Present Illness 87yo male who presented on 12/25 with abdominal pain, nausea, and vomiting; found to have incarcerated inguinal hernia (chronic) which is now causing SBO. Received L incarcerated hernia repair on 12/25. No significant PMH/PSH    PT Comments    Pt does present with slurred speech today, LUE weaker with decr AROM shoulder compared to RUE, unsure of pt's baseline and no family present at this time. Assisted pt to transport chair, pt going down for CT. Will continue to follow, update d/c rec's as needed   Recommendations for follow up therapy are one component of a multi-disciplinary discharge planning process, led by the attending physician.  Recommendations may be updated based on patient status, additional functional criteria and insurance authorization.  Follow Up Recommendations  Home health PT     Assistance Recommended at Discharge Frequent or constant Supervision/Assistance  Equipment Recommendations  Rolling walker (2 wheels);BSC/3in1    Recommendations for Other Services       Precautions / Restrictions Precautions Precautions: Fall;Other (comment) Precaution Comments: abdominal incision Restrictions Weight Bearing Restrictions: No     Mobility  Bed Mobility Overal bed mobility: Needs Assistance Bed Mobility: Rolling;Sidelying to Sit Rolling: Supervision Sidelying to sit: Min assist       General bed mobility comments: hand over hand cues for use of rail and self assist, assist to elevate trunk    Transfers Overall transfer level: Needs assistance Equipment used: Rolling walker (2 wheels) Transfers: Sit to/from Stand Sit to Stand: Min assist           General transfer comment: unsteady on initial standing, assist to rise and transition to RW    Ambulation/Gait Ambulation/Gait assistance: Min assist Gait  Distance (Feet): 18 Feet Assistive device: Rolling walker (2 wheels) Gait Pattern/deviations: Step-through pattern;Decreased stride length;Trunk flexed       General Gait Details: unsteady with RW,  MinA for device management and multi-modal cues for RW management, position from self   Stairs             Wheelchair Mobility    Modified Rankin (Stroke Patients Only)       Balance Overall balance assessment: Needs assistance Sitting-balance support: No upper extremity supported;Feet supported Sitting balance-Leahy Scale: Good     Standing balance support: Bilateral upper extremity supported;During functional activity;Reliant on assistive device for balance Standing balance-Leahy Scale: Poor Standing balance comment: unable to static stand without UE support today                            Cognition Arousal/Alertness: Awake/alert Behavior During Therapy: WFL for tasks assessed/performed;Flat affect Overall Cognitive Status: Impaired/Different from baseline Area of Impairment: Following commands;Safety/judgement;Problem solving                       Following Commands: Follows one step commands with increased time;Follows multi-step commands inconsistently Safety/Judgement: Decreased awareness of safety   Problem Solving: Decreased initiation;Slow processing;Difficulty sequencing;Requires verbal cues;Requires tactile cues General Comments: cooperative, requires repetition of cues and commands, incr processing time needed        Exercises      General Comments        Pertinent Vitals/Pain Pain Assessment: No/denies pain    Home Living  Prior Function            PT Goals (current goals can now be found in the care plan section) Acute Rehab PT Goals Patient Stated Goal: go stay with daughter in [place] while he recovers PT Goal Formulation: With patient/family Time For Goal Achievement:  10/17/21 Potential to Achieve Goals: Good Progress towards PT goals: Progressing toward goals    Frequency    Min 3X/week      PT Plan Current plan remains appropriate    Co-evaluation              AM-PAC PT "6 Clicks" Mobility   Outcome Measure  Help needed turning from your back to your side while in a flat bed without using bedrails?: A Little Help needed moving from lying on your back to sitting on the side of a flat bed without using bedrails?: A Little Help needed moving to and from a bed to a chair (including a wheelchair)?: A Little Help needed standing up from a chair using your arms (e.g., wheelchair or bedside chair)?: A Little Help needed to walk in hospital room?: A Little Help needed climbing 3-5 steps with a railing? : A Lot 6 Click Score: 17    End of Session Equipment Utilized During Treatment: Gait belt Activity Tolerance: Patient tolerated treatment well Patient left: Other (comment) (transport chair for nursing to take pt to CT) Nurse Communication: Mobility status PT Visit Diagnosis: Unsteadiness on feet (R26.81);Difficulty in walking, not elsewhere classified (R26.2);Muscle weakness (generalized) (M62.81)     Time: 3570-1779 PT Time Calculation (min) (ACUTE ONLY): 10 min  Charges:  $Gait Training: 8-22 mins                     Delice Bison, PT  Acute Rehab Dept ([facility]) [phone] Pager [phone]  10/04/2021    [facility] 10/04/2021, 11:54 AM

## 2021-10-04 NOTE — TOC Progression Note (Addendum)
Transition of Care [facility]) - Progression Note    Patient Details  Name: Cameron Barton MRN: [id number] Date of Birth: 1929/07/06  Transition of Care [facility]) CM/SW Contact  Cameron Barton, Cameron Messier, RN Phone Number: 10/04/2021, 10:07 AM  Clinical Narrative: Noted-PT recc HHPT;dme recc rw;3n1. Spoke to grand daughter Cameron Barton(contact person)-d/c plans  will d/c to [facility] to stay w/dtr Cameron Barton:address:[address] [place] [address]-[facility] preference for [facility] agency or dme-Will check w/HHC agencies for acceptance;[employer] rep Cameron Barton for dme-will deliver rw;3n1 to rm prior d/c. Family will transport home on own.    4p-Currently-[facility] /[facility]-not able to accept. [employer] rep Cameron Barton for [place] area checking if able to accept-await outcome.    Expected Discharge Plan: Home w Home Health Services Barriers to Discharge: Continued Medical Work up  Expected Discharge Plan and Services Expected Discharge Plan: Home w Home Health Services   Discharge Planning Services: CM Consult                     DME Arranged: 3-N-1, Dan Humphreys rolling DME Agency: [employer] Date DME Agency Contacted: 10/04/21 Time DME Agency Contacted: 1007 Representative spoke with at DME Agency: Cameron Barton             Social Determinants of Health (SDOH) Interventions    Readmission Risk Interventions No flowsheet data found.

## 2021-10-04 NOTE — Progress Notes (Signed)
2 Days Post-Op   Subjective/Chief Complaint: Patient doing very well Ate his entire breakfast tray this morning - no nausea or vomiting Passing flatus but no BM yet No abdominal distention Minimal surgical pain   Objective: Vital signs in last 24 hours: Temp:  [98.3 F (36.8 C)-99.5 F (37.5 C)] 98.9 F (37.2 C) (12/27 0630) Pulse Rate:  [88-95] 90 (12/27 0630) Resp:  [16-18] 18 (12/27 0630) BP: (122-137)/(67-75) 125/67 (12/27 0630) SpO2:  [94 %-100 %] 96 % (12/27 0630) Last BM Date:  (Pt unable to say)  Intake/Output from previous day: 12/26 0701 - 12/27 0700 In: 240 [P.O.:240] Out: 0  Intake/Output this shift: No intake/output data recorded.  General appearance: alert, cooperative, and no distress GI: soft, non-distended; minimal left groin tenderness and swelling Left inguinal incision c/d/I  Lab Results:  Recent Labs    10/02/21 1730  WBC 9.6  HGB 17.9*  HCT 54.8*  PLT 405*   BMET Recent Labs    10/02/21 1730 10/02/21 2304  NA 139 138  K 5.0 3.5  CL 104 105  CO2 20* 28  GLUCOSE 90 146*  BUN QUANTITY NOT SUFFICIENT FOR TESTING, PLEASE RESUBMIT SAMPLE. 18  CREATININE 0.99 0.84  CALCIUM 9.4 9.1   PT/INR No results for input(s): LABPROT, INR in the last 72 hours. ABG No results for input(s): PHART, HCO3 in the last 72 hours.  Invalid input(s): PCO2, PO2  Studies/Results: CT ABDOMEN PELVIS WO CONTRAST  Result Date: 10/02/2021 CLINICAL DATA:  Nausea and abdominal pain. EXAM: CT ABDOMEN AND PELVIS WITHOUT CONTRAST TECHNIQUE: Multidetector CT imaging of the abdomen and pelvis was performed following the standard protocol without IV contrast. COMPARISON:  September 29, 2010 FINDINGS: Lower chest: No acute abnormality. Hepatobiliary: Tiny hepatic hypodensities are too small to accurately characterize but statistically likely to reflect a benign entity such as cysts or hemangiomas. Cholelithiasis without CT findings of acute cholecystitis. No biliary ductal  dilation. Pancreas: No pancreatic ductal dilation or evidence of acute inflammation. Spleen: Within normal limits. Adrenals/Urinary Tract: Bilateral adrenal glands are unremarkable. No hydronephrosis. No renal, ureteral or bladder calculi identified. Urinary bladder is unremarkable for degree of distension. Stomach/Bowel: No enteric contrast was administered. Stomach is unremarkable for degree of distension. Prominent loops of small bowel in the left hemiabdomen with transition to decompressed bowel at a fat and bowel containing left inguinal hernia, within the left inguinal hernia sac there is dilated loops of small bowel measuring up to 3.1 cm with decompressed bowel at both efferent and afferent limbs of bowel. Additionally there is adjacent inflammatory fluid within the hernia sac and a portion of nondilated sigmoid colon. No pneumoperitoneum. Vascular/Lymphatic: Aortic and branch vessel atherosclerosis without abdominal aortic aneurysm. No pathologically enlarged abdominal or pelvic lymph nodes. Reproductive: Prostate is unremarkable. Other: No pneumoperitoneum. No significant abdominopelvic free fluid. Musculoskeletal: Advanced multilevel degenerative changes spine. No acute osseous abnormality. IMPRESSION: 1. Small-bowel obstruction with transition point at a fat and bowel containing left inguinal hernia, with closed loop morphology of the obstructed loops of small bowel within the hernia sac. No pneumoperitoneum or pneumatosis. 2. Cholelithiasis without CT findings of acute cholecystitis. 3.  Aortic Atherosclerosis (ICD10-I70.0). Electronically Signed   By: Maudry Mayhew M.D.   On: 10/02/2021 18:28   DG Abd 1 View  Result Date: 10/02/2021 CLINICAL DATA:  Nasogastric tube placement. EXAM: ABDOMEN - 1 VIEW COMPARISON:  Same day abdominal radiograph at 2140 hours FINDINGS: Nasogastric tube with tip and side port overlying the stomach. IMPRESSION: Nasogastric tube  with tip and side port overlying the  stomach. Electronically Signed   By: Maudry Mayhew M.D.   On: 10/02/2021 22:28   DG Abd 1 View  Result Date: 10/02/2021 CLINICAL DATA:  Check gastric catheter placement EXAM: ABDOMEN - 1 VIEW COMPARISON:  CT from earlier in the same day. FINDINGS: Gastric catheter is noted coiled within the oropharynx and hypopharynx. This should be withdrawn completely and reinserted. Increasing density is noted in the right upper lobe consistent with right upper lobe consolidation. IMPRESSION: Gastric catheter coiled within the oropharynx and hypopharynx. This should be withdrawn totally and readvanced. Electronically Signed   By: Alcide Clever M.D.   On: 10/02/2021 21:57    Anti-infectives: Anti-infectives (From admission, onward)    Start     Dose/Rate Route Frequency Ordered Stop   10/02/21 1935  ceFAZolin (ANCEF) 2-4 GM/100ML-% IVPB       Note to Pharmacy: Sherron Flemings: cabinet override      10/02/21 1935 10/02/21 2002   10/02/21 1930  ceFAZolin (ANCEF) IVPB 2g/100 mL premix        2 g 200 mL/hr over 30 Minutes Intravenous On call to O.R. 10/02/21 1926 10/02/21 2016       Assessment/Plan: s/p Procedure(s): HERNIA REPAIR INGUINAL INCARCERATED (Left) Primary repair of incarcerated left inguinal hernia 10/02/21 - Dr. Marlyn Corporal diet but no bowel function yet Will give Miralax today Probable discharge tomorrow   Discussed with family  His daughter Donalynn Furlong thinks that his speech "sounds slurred" so she is requesting a head Ct scan.  I am not familiar with his usual speech pattern.  We will get a head CT scan per family request.  LOS: 1 day    Wynona Luna 10/04/2021

## 2021-10-05 NOTE — Discharge Summary (Signed)
Physician Discharge Summary  Patient ID: Cameron Barton MRN: [id number] DOB/AGE: Nov 21, 1928 87 y.o.  Admit date: 10/02/2021 Discharge date: 10/05/2021  Admission Diagnoses: Incarcerated left inguinal hernia  Discharge Diagnoses: Incarcerated left inguinal hernia Principal Problem:   Inguinal hernia of left side with obstruction Active Problems:   Hernia, inguinal   Discharged Condition: good  Hospital Course: Admitted 10/02/21 with obstruction secondary to incarcerated left inguinal hernia.  Underwent emergent repair with primary closure.  The sigmoid colon and small bowel appeared viable.  He did quite well after surgery.  His bowel function returned and he had a small bowel movement on 10/04/21.  Minimal pain - tylenol only PRN.    His family felt that his speech might be slurred and they were concerned for possible CVA.  Head CT was negative.  He seems to be neurologically intact today.  PT evaluated the patient and recommended home health PT and a rolling walker.  This has been ordered.  Consults:  Physical Therapy  Significant Diagnostic Studies: radiology: CT scan: Head CT 10/04/21 - negative  Treatments: surgery: open primary repair of incarcerated left inguinal hernia  Discharge Exam: Blood pressure 126/73, pulse 78, temperature 98.3 F (36.8 C), resp. rate 18, height 5\' 8"  (1.727 m), weight 77.4 kg, SpO2 100 %. General appearance: alert, cooperative, and no distress GI: soft, non-tender, non-distended Left inguinal incision - minimal swelling, minimal tenderness, no sign of recurrent hernia, no scrotal swelling  Disposition: Discharge disposition: 01-Home or Self Care       Discharge Instructions     Call MD for:  persistant nausea and vomiting   Complete by: As directed    Call MD for:  redness, tenderness, or signs of infection (pain, swelling, redness, odor or green/yellow discharge around incision site)   Complete by: As directed    Call MD for:  severe  uncontrolled pain   Complete by: As directed    Call MD for:  temperature >100.4   Complete by: As directed    Diet general   Complete by: As directed    Driving Restrictions   Complete by: As directed    Do not drive while taking pain medications   Increase activity slowly   Complete by: As directed    May shower / Bathe   Complete by: As directed       Allergies as of 10/05/2021   No Known Allergies      Medication List     TAKE these medications    acetaminophen 500 MG tablet Commonly known as: TYLENOL Take 500 mg by mouth every 6 (six) hours as needed for moderate pain.               Durable Medical Equipment  (From admission, onward)           Start     Ordered   10/04/21 1006  For home use only DME 3 n 1  Once        10/04/21 1005   10/04/21 1005  For home use only DME Walker rolling  Once       Question Answer Comment  Walker: With 5 Inch Wheels   Patient needs a walker to treat with the following condition Unsteady gait      10/04/21 1005   10/04/21 1003  For home use only DME Walker rolling  Once       Question Answer Comment  Walker: With 5 Inch Wheels   Patient needs a walker to  treat with the following condition Unsteady gait      10/04/21 1004            Follow-up Information     Romie Levee, MD. Schedule an appointment as soon as possible for a visit in 3 week(s).   Specialties: General Surgery, Colon and Rectal Surgery Contact information: [address] [place] [address] [phone]                 Signed: Wynona Luna 10/05/2021, 8:00 AM

## 2021-10-05 NOTE — TOC Transition Note (Addendum)
Transition of Care [facility]) - CM/SW Discharge Note   Patient Details  Name: Cameron Barton MRN: [id number] Date of Birth: 1929/07/06  Transition [facility]) CM/SW Contact:  Lanier Clam, RN Phone Number: 10/05/2021, 9:43 AM   Clinical Narrative:  Confirmed from [facility] daughter Shah-patient will stay at home address on face sheet. [facility] can accept for HHPT-start of care beginning of next week. Family will transport home on own.No further CM needs. 9:59a-TC Linda's phone # Sherryll Burger answered phone-confirmed address patient will stay-address on face sheet [place], & HHC set up. Informed nurse.    Final next level of care: Home w Home Health Services Barriers to Discharge: No Barriers Identified   Patient Goals and CMS Choice        Discharge Placement                       Discharge Plan and Services   Discharge Planning Services: CM Consult            DME Arranged: 3-N-1, Walker rolling DME Agency: [employer] Date DME Agency Contacted: 10/04/21 Time DME Agency Contacted: 1007 Representative spoke with at DME Agency: Doristine Bosworth Agency: [facility] Date [facility] Agency Contacted: 10/05/21 Time HH Agency Contacted: [phone] Representative spoke with at [facility] Agency: Sue Lush  Social Determinants of Health (SDOH) Interventions     Readmission Risk Interventions No flowsheet data found.

## 2021-10-05 NOTE — Progress Notes (Signed)
Reviewed written d/c instructions w pt and his sister Jacqulyn Cane. All questions answered and they both verbalized understanding. Mattie confirms that she will stay w the pt at his house for 2 days, then [facility] (dtr) will come and pick him up and stay w her in [place]. D/C via w/c w DME w all belongings in stable condition.

## 2021-10-05 NOTE — Discharge Instructions (Signed)
[facility] _______[facility]  UMBILICAL OR INGUINAL HERNIA REPAIR: POST OP INSTRUCTIONS  Always review your discharge instruction sheet given to you by the facility where your surgery was performed. IF YOU HAVE DISABILITY OR FAMILY LEAVE FORMS, YOU MUST BRING THEM TO THE OFFICE FOR PROCESSING.   DO NOT GIVE THEM TO YOUR DOCTOR.  1. A  prescription for pain medication may be given to you upon discharge.  Take your pain medication as prescribed, if needed.  If narcotic pain medicine is not needed, then you may take acetaminophen (Tylenol) or ibuprofen (Advil) as needed. 2. Take your usually prescribed medications unless otherwise directed. If you need a refill on your pain medication, please contact your pharmacy.  They will contact our office to request authorization. Prescriptions will not be filled after 5 pm or on week-ends. 3. You should follow a light diet the first 24 hours after arrival home, such as soup and crackers, etc.  Be sure to include lots of fluids daily.  Resume your normal diet the day after surgery. 4.Most patients will experience some swelling and bruising around the umbilicus or in the groin and scrotum.  Ice packs and reclining will help.  Swelling and bruising can take several days to resolve.  6. It is common to experience some constipation if taking pain medication after surgery.  Increasing fluid intake and taking a stool softener (such as Colace) will usually help or prevent this problem from occurring.  A mild laxative (Milk of Magnesia or Miralax) should be taken according to package directions if there are no bowel movements after 48 hours. 7. Unless discharge instructions indicate otherwise, you may remove your bandages 24-48 hours after surgery, and you may shower at that time.  You may have steri-strips (small skin tapes) in place directly over the incision.  These strips should be left on the skin for 7-10 days.  If your surgeon used skin glue on the  incision, you may shower in 24 hours.  The glue will flake off over the next 2-3 weeks.  Any sutures or staples will be removed at the office during your follow-up visit. 8. ACTIVITIES:  You may resume regular (light) daily activities beginning the next day--such as daily self-care, walking, climbing stairs--gradually increasing activities as tolerated.  You may have sexual intercourse when it is comfortable.  Refrain from any heavy lifting or straining until approved by your doctor.  a.You may drive when you are no longer taking prescription pain medication, you can comfortably wear a seatbelt, and you can safely maneuver your car and apply brakes. b.RETURN TO WORK:   _____________________________________________  9.You should see your doctor in the office for a follow-up appointment approximately 2-3 weeks after your surgery.  Make sure that you call for this appointment within a day or two after you arrive home to insure a convenient appointment time. 10.OTHER INSTRUCTIONS: _________________________    _____________________________________  WHEN TO CALL YOUR DOCTOR: Fever over 101.0 Inability to urinate Nausea and/or vomiting Extreme swelling or bruising Continued bleeding from incision. Increased pain, redness, or drainage from the incision  The clinic staff is available to answer your questions during regular business hours.  Please don't hesitate to call and ask to speak to one of the nurses for clinical concerns.  If you have a medical emergency, go to the nearest emergency room or call 911.  A surgeon from [facility] is always on call at the hospital   [address], [address],   [place], [place]  [address] ?  [address] [address], [place], [place]   [address] [phone] ? [phone] ? FAX [phone] Web site: [email or web address]  

## 2021-10-05 NOTE — TOC Progression Note (Signed)
Transition of Care [facility]) - Progression Note    Patient Details  Name: Cameron Barton MRN: [id number] Date of Birth: 05/04/1929  Transition of Care [facility]) CM/SW Contact  Hoyle Barkdull, Olegario Messier, RN Phone Number: 10/05/2021, 9:36 AM  Clinical Narrative: TOC checking with Rolene Arbour for outcome on HHPT if can accept.Dme has been delivered.      Expected Discharge Plan: Home w Home Health Services Barriers to Discharge: Continued Medical Work up  Expected Discharge Plan and Services Expected Discharge Plan: Home w Home Health Services   Discharge Planning Services: CM Consult     Expected Discharge Date: 10/05/21               DME Arranged: 3-N-1, Walker rolling DME Agency: [employer] Date DME Agency Contacted: 10/04/21 Time DME Agency Contacted: 1007 Representative spoke with at DME Agency: Germaine             Social Determinants of Health (SDOH) Interventions    Readmission Risk Interventions No flowsheet data found.

## 2022-08-07 DIAGNOSIS — Z79899 Other long term (current) drug therapy: Secondary | ICD-10-CM | POA: Diagnosis not present

## 2022-08-07 DIAGNOSIS — I10 Essential (primary) hypertension: Secondary | ICD-10-CM | POA: Diagnosis not present

## 2022-08-07 DIAGNOSIS — R269 Unspecified abnormalities of gait and mobility: Secondary | ICD-10-CM | POA: Diagnosis not present

## 2022-08-07 DIAGNOSIS — R471 Dysarthria and anarthria: Secondary | ICD-10-CM | POA: Diagnosis not present

## 2022-08-07 DIAGNOSIS — G308 Other Alzheimer's disease: Secondary | ICD-10-CM | POA: Diagnosis not present

## 2022-08-07 DIAGNOSIS — E211 Secondary hyperparathyroidism, not elsewhere classified: Secondary | ICD-10-CM | POA: Diagnosis not present

## 2022-08-07 DIAGNOSIS — E559 Vitamin D deficiency, unspecified: Secondary | ICD-10-CM | POA: Diagnosis not present

## 2022-08-07 DIAGNOSIS — Z23 Encounter for immunization: Secondary | ICD-10-CM | POA: Diagnosis not present

## 2022-08-07 DIAGNOSIS — K5900 Constipation, unspecified: Secondary | ICD-10-CM | POA: Diagnosis not present

## 2023-02-19 ENCOUNTER — Emergency Department (HOSPITAL_COMMUNITY): Payer: Medicare Other

## 2023-02-19 ENCOUNTER — Encounter (HOSPITAL_COMMUNITY): Payer: Self-pay

## 2023-02-19 ENCOUNTER — Other Ambulatory Visit: Payer: Self-pay

## 2023-02-19 ENCOUNTER — Inpatient Hospital Stay (HOSPITAL_COMMUNITY)
Admission: EM | Admit: 2023-02-19 | Discharge: 2023-02-26 | DRG: 065 | Disposition: A | Discharge status: Skilled Nursing Facility | Payer: Medicare Other | Attending: Family Medicine | Admitting: Family Medicine

## 2023-02-19 DIAGNOSIS — R29708 NIHSS score 8: Secondary | ICD-10-CM | POA: Diagnosis present

## 2023-02-19 DIAGNOSIS — F0390 Unspecified dementia without behavioral disturbance: Secondary | ICD-10-CM | POA: Insufficient documentation

## 2023-02-19 DIAGNOSIS — R2990 Unspecified symptoms and signs involving the nervous system: Principal | ICD-10-CM

## 2023-02-19 DIAGNOSIS — Z751 Person awaiting admission to adequate facility elsewhere: Secondary | ICD-10-CM

## 2023-02-19 DIAGNOSIS — I63511 Cerebral infarction due to unspecified occlusion or stenosis of right middle cerebral artery: Secondary | ICD-10-CM | POA: Diagnosis present

## 2023-02-19 DIAGNOSIS — I6381 Other cerebral infarction due to occlusion or stenosis of small artery: Secondary | ICD-10-CM | POA: Diagnosis not present

## 2023-02-19 DIAGNOSIS — K219 Gastro-esophageal reflux disease without esophagitis: Secondary | ICD-10-CM | POA: Diagnosis present

## 2023-02-19 DIAGNOSIS — R410 Disorientation, unspecified: Secondary | ICD-10-CM | POA: Insufficient documentation

## 2023-02-19 DIAGNOSIS — F05 Delirium due to known physiological condition: Secondary | ICD-10-CM | POA: Diagnosis not present

## 2023-02-19 DIAGNOSIS — G8194 Hemiplegia, unspecified affecting left nondominant side: Secondary | ICD-10-CM | POA: Diagnosis present

## 2023-02-19 DIAGNOSIS — Z8673 Personal history of transient ischemic attack (TIA), and cerebral infarction without residual deficits: Secondary | ICD-10-CM

## 2023-02-19 DIAGNOSIS — E785 Hyperlipidemia, unspecified: Secondary | ICD-10-CM | POA: Diagnosis present

## 2023-02-19 DIAGNOSIS — W06XXXA Fall from bed, initial encounter: Secondary | ICD-10-CM | POA: Diagnosis present

## 2023-02-19 DIAGNOSIS — Z79899 Other long term (current) drug therapy: Secondary | ICD-10-CM

## 2023-02-19 DIAGNOSIS — I10 Essential (primary) hypertension: Secondary | ICD-10-CM | POA: Diagnosis present

## 2023-02-19 DIAGNOSIS — R29810 Facial weakness: Secondary | ICD-10-CM | POA: Diagnosis present

## 2023-02-19 DIAGNOSIS — I639 Cerebral infarction, unspecified: Secondary | ICD-10-CM | POA: Diagnosis not present

## 2023-02-19 DIAGNOSIS — R531 Weakness: Secondary | ICD-10-CM

## 2023-02-19 DIAGNOSIS — F03911 Unspecified dementia, unspecified severity, with agitation: Secondary | ICD-10-CM | POA: Diagnosis not present

## 2023-02-19 DIAGNOSIS — Z9181 History of falling: Secondary | ICD-10-CM

## 2023-02-19 DIAGNOSIS — R4701 Aphasia: Secondary | ICD-10-CM | POA: Diagnosis present

## 2023-02-19 DIAGNOSIS — Z66 Do not resuscitate: Secondary | ICD-10-CM | POA: Diagnosis present

## 2023-02-19 DIAGNOSIS — Z7982 Long term (current) use of aspirin: Secondary | ICD-10-CM

## 2023-02-19 DIAGNOSIS — Z515 Encounter for palliative care: Secondary | ICD-10-CM

## 2023-02-19 DIAGNOSIS — Z87891 Personal history of nicotine dependence: Secondary | ICD-10-CM

## 2023-02-19 LAB — CBC
HCT: 48.3 % (ref 39.0–52.0)
Hemoglobin: 15.8 g/dL (ref 13.0–17.0)
MCH: 27.6 pg (ref 26.0–34.0)
MCHC: 32.7 g/dL (ref 30.0–36.0)
MCV: 84.4 fL (ref 80.0–100.0)
Platelets: 395 10*3/uL (ref 150–400)
RBC: 5.72 MIL/uL (ref 4.22–5.81)
RDW: 13.6 % (ref 11.5–15.5)
WBC: 5.4 10*3/uL (ref 4.0–10.5)
nRBC: 0 % (ref 0.0–0.2)

## 2023-02-19 LAB — I-STAT CHEM 8, ED
BUN: 16 mg/dL (ref 8–23)
Calcium, Ion: 1 mmol/L — ABNORMAL LOW (ref 1.15–1.40)
Chloride: 104 mmol/L (ref 98–111)
Creatinine, Ser: 0.9 mg/dL (ref 0.61–1.24)
Glucose, Bld: 91 mg/dL (ref 70–99)
HCT: 48 % (ref 39.0–52.0)
Hemoglobin: 16.3 g/dL (ref 13.0–17.0)
Potassium: 6.7 mmol/L (ref 3.5–5.1)
Sodium: 137 mmol/L (ref 135–145)
TCO2: 27 mmol/L (ref 22–32)

## 2023-02-19 LAB — DIFFERENTIAL
Abs Immature Granulocytes: 0.04 10*3/uL (ref 0.00–0.07)
Basophils Absolute: 0.1 10*3/uL (ref 0.0–0.1)
Basophils Relative: 1 %
Eosinophils Absolute: 0 10*3/uL (ref 0.0–0.5)
Eosinophils Relative: 0 %
Immature Granulocytes: 1 %
Lymphocytes Relative: 23 %
Lymphs Abs: 1.2 10*3/uL (ref 0.7–4.0)
Monocytes Absolute: 0.8 10*3/uL (ref 0.1–1.0)
Monocytes Relative: 14 %
Neutro Abs: 3.3 10*3/uL (ref 1.7–7.7)
Neutrophils Relative %: 61 %

## 2023-02-19 LAB — COMPREHENSIVE METABOLIC PANEL
ALT: 10 U/L (ref 0–44)
AST: 16 U/L (ref 15–41)
Albumin: 3.7 g/dL (ref 3.5–5.0)
Alkaline Phosphatase: 44 U/L (ref 38–126)
Anion gap: 11 (ref 5–15)
BUN: 11 mg/dL (ref 8–23)
CO2: 24 mmol/L (ref 22–32)
Calcium: 9.2 mg/dL (ref 8.9–10.3)
Chloride: 102 mmol/L (ref 98–111)
Creatinine, Ser: 0.91 mg/dL (ref 0.61–1.24)
GFR, Estimated: >60 mL/min (ref >60)
Glucose, Bld: 91 mg/dL (ref 70–99)
Potassium: 4.4 mmol/L (ref 3.5–5.1)
Sodium: 137 mmol/L (ref 135–145)
Total Bilirubin: 0.5 mg/dL (ref 0.3–1.2)
Total Protein: 6.4 g/dL — ABNORMAL LOW (ref 6.5–8.1)

## 2023-02-19 LAB — PROTIME-INR
INR: 1.1 (ref 0.8–1.2)
Prothrombin Time: 14.6 seconds (ref 11.4–15.2)

## 2023-02-19 LAB — HEPATIC FUNCTION PANEL
ALT: 10 U/L (ref 0–44)
AST: 16 U/L (ref 15–41)
Albumin: 3.7 g/dL (ref 3.5–5.0)
Alkaline Phosphatase: 45 U/L (ref 38–126)
Bilirubin, Direct: 0.2 mg/dL (ref 0.0–0.2)
Indirect Bilirubin: 0.3 mg/dL (ref 0.3–0.9)
Total Bilirubin: 0.5 mg/dL (ref 0.3–1.2)
Total Protein: 6.6 g/dL (ref 6.5–8.1)

## 2023-02-19 LAB — HEMOGLOBIN A1C
Hgb A1c MFr Bld: 4.9 % (ref 4.8–5.6)
Mean Plasma Glucose: 93.93 mg/dL

## 2023-02-19 LAB — CBG MONITORING, ED: Glucose-Capillary: 90 mg/dL (ref 70–99)

## 2023-02-19 LAB — MAGNESIUM: Magnesium: 1.8 mg/dL (ref 1.7–2.4)

## 2023-02-19 LAB — APTT: aPTT: 31 seconds (ref 24–36)

## 2023-02-19 MED ORDER — SODIUM CHLORIDE 0.9 % IV BOLUS
1000.0000 mL | Freq: Once | INTRAVENOUS | Status: AC
  Administered 2023-02-19: 1000 mL via INTRAVENOUS

## 2023-02-19 MED ORDER — STROKE: EARLY STAGES OF RECOVERY BOOK
Freq: Once | Status: AC
  Filled 2023-02-19: qty 1

## 2023-02-19 MED ORDER — ACETAMINOPHEN 650 MG RE SUPP: 650.0000 mg | Freq: Four times a day (QID) | RECTAL | Status: DC | PRN

## 2023-02-19 MED ORDER — IOHEXOL 350 MG/ML SOLN
125.0000 mL | Freq: Once | INTRAVENOUS | Status: AC | PRN
  Administered 2023-02-19: 125 mL via INTRAVENOUS

## 2023-02-19 MED ORDER — ENOXAPARIN SODIUM 40 MG/0.4ML IJ SOSY
40.0000 mg | PREFILLED_SYRINGE | INTRAMUSCULAR | Status: DC
  Administered 2023-02-20 – 2023-02-26 (×7): 40 mg via SUBCUTANEOUS
  Filled 2023-02-19 (×7): qty 0.4

## 2023-02-19 MED ORDER — SODIUM CHLORIDE 0.9% FLUSH
3.0000 mL | Freq: Once | INTRAVENOUS | Status: AC
  Administered 2023-02-19: 3 mL via INTRAVENOUS

## 2023-02-19 MED ORDER — ASPIRIN 81 MG PO TBEC
81.0000 mg | DELAYED_RELEASE_TABLET | Freq: Every day | ORAL | Status: DC
  Administered 2023-02-19 – 2023-02-26 (×8): 81 mg via ORAL
  Filled 2023-02-19 (×8): qty 1

## 2023-02-19 MED ORDER — ACETAMINOPHEN 325 MG PO TABS: 650.0000 mg | ORAL_TABLET | Freq: Four times a day (QID) | ORAL | Status: DC | PRN

## 2023-02-19 MED ORDER — CLOPIDOGREL BISULFATE 75 MG PO TABS
75.0000 mg | ORAL_TABLET | Freq: Every day | ORAL | Status: DC
  Administered 2023-02-20 – 2023-02-26 (×7): 75 mg via ORAL
  Filled 2023-02-19 (×7): qty 1

## 2023-02-19 MED ORDER — HALOPERIDOL LACTATE 5 MG/ML IJ SOLN: 2.0000 mg | Freq: Once | INTRAMUSCULAR | Status: DC

## 2023-02-19 MED ORDER — LORAZEPAM 1 MG PO TABS: 1.0000 mg | ORAL_TABLET | Freq: Once | ORAL | Status: DC | PRN

## 2023-02-19 MED ORDER — CLOPIDOGREL BISULFATE 300 MG PO TABS
300.0000 mg | ORAL_TABLET | Freq: Once | ORAL | Status: AC
  Administered 2023-02-19: 300 mg via ORAL
  Filled 2023-02-19: qty 1

## 2023-02-19 MED ORDER — LORAZEPAM 0.5 MG PO TABS
0.5000 mg | ORAL_TABLET | Freq: Once | ORAL | Status: AC | PRN
  Administered 2023-02-19: 0.5 mg via ORAL
  Filled 2023-02-19: qty 1

## 2023-02-19 NOTE — H&P (Addendum)
Hospital Admission History and Physical Service Pager: [phone]  Patient name: Cameron Barton Medical record number: [id number] Date of Birth: April 28, 1929 Age: 89 y.o. Gender: male  Primary Care Provider: System, Provider Not In Consultants: neurology Code Status: FULL Preferred Emergency Contact: Gillian Shields, sister - sister requests that his case not be discussed with anyone other than sister, friend Corena Pilgrim, and his pastor Merleen Nicely Contact Information     Name Relation Home Work Mobile   Jones,Linda Daughter   [phone]   Gillian Shields Sister [phone]  [phone]   DAWKINS,ELIZABETH ANN Clearence Cheek   [phone]        Chief Complaint: left-sided weakness  Assessment and Plan: Cameron Barton is a 89 y.o. male presenting with left-sided weakness presenting as a code stroke. Differential for this patient's presentation of this includes CVA, TIA and less likely mechanical trauma, seizure disorder.  * Left-sided weakness Patient presents with fluctuating left-sided weakness which started around 3:30 PM today.  Patient has a history of headaches and falls.  No palpitations or chest pain. On exam patient has left-sided facial droop and upper extremity weakness.  Patient's speech is difficult under stand however his sister reports this is his baseline speech.  Creatinine was at baseline (0.9) and EKG showed sinus rhythm.   CT head showed no acute intracranial abnormality.  CTA head and neck had suboptimal contrast bolus and within this limitation does not show large vessel occlusion or significant stenosis in the head or neck.  At this time it is possible that patient had lacunar stroke.  Neurology on board.  Will obtain MRI brain and MRA head and neck to clarify.  Appreciate their recommendation. -Admit to progressive with FPTS, attending Dr. Deirdre Priest -Neurology consulted in the ER, appreciate recs -MRA head and MRI brain pending -Echo pending -EEG pending -Aspirin 81  mg daily -Plavix 300 mg load with 75 mg daily for 21 - 90 day course pending further vessel imaging -Continuous cardiac monitoring -PT/OT eval and treat -SLP eval -Neurochecks Q2H -Fall precaution -Continue routine vitals -Risk stratify with lipid panel, TSH       FEN/GI: Regular diet (passed stroke swallow screen) VTE Prophylaxis: Lovenox  Disposition: Med telemetry  History of Present Illness:  Cameron Barton is a 89 y.o. male presenting with left-sided weakness.  Patient reports he slid off the bed this morning due to weakness.   Sister reports he has had chronic issues with headache. She reports he had two falls prior to arrival. No known head injury. Family members report that he was not using his left arm at all. He was unable to stand up on his own.  At baseline family members report he has some difficulty with balance but is able to ambulate. Reports diffuse sweating, facial droop. Has had some intermittent aphasia, difficulty understanding his speech; however, speech is at baseline now per family.  Sister reports he was hospitalized in [place] after a fall.  In the ED, neurology was consulted and CT head and CTA head and neck was done.  MRA head and MRI brain ordered.  The following labs were ordered: CBC with differential, lipid panel, A1c, CMP, hepatic function panel, magnesium.  Review Of Systems: Per HPI with the following additions: Denies fever  Pertinent Past Medical History: No history of CVA or seizure Remainder reviewed in history tab.   Pertinent Past Surgical History: Hernia repair last year  Remainder reviewed in history tab.  Pertinent Social History: Tobacco use: Yes/No/Former Alcohol  use: no Other Substance use: no Lives with friend temporarily while looking for facility  Pertinent Family History: None  Important Outpatient Medications: No prescription medications Does take Mucinex Remainder reviewed in medication history.    Objective: BP (!) 150/90   Pulse 73   Resp 16   Ht 5\' 8"  (1.727 m)   Wt 69.2 kg   SpO2 100%   BMI 23.20 kg/m  Exam: General: Elderly male in bed, NAD Cardiovascular: RRR, no M/R/G Respiratory: CTAB, normal effort on room air Gastrointestinal: Nontender, nondistended MSK: Pulses on lower extremities felt bilaterally Neuro: Mental Status -  Level of arousal and orientation to time, place, and person were intact. Fund of Knowledge was assessed and was intact.   Cranial Nerves II - XII - II - Visual field intact OU. III, IV, VI - Extraocular movements intact. V - Facial sensation intact bilaterally. VII - nasolabial folds asymmetrical on the left side VIII - Hearing & vestibular intact bilaterally. X - Palate elevates symmetrically. XI - Chin turning & shoulder shrug intact bilaterally. XII - Tongue protrusion intact.   Cerebellar - Finger to nose exam limited on the left side due to weakness Heal to shin exam intact on right side, some dysmetria on the left side  Motor Strength - The patient's strength was 5/5 in RUE and RLE, 4/5 LUE, and 5/5 LLE. Bulk was normal and fasciculations were absent.   Motor Tone - Muscle tone was assessed at the neck and appendages and was normal.   Sensory - Light touch assessed and symmetrical.     Labs:  CBC BMET  Recent Labs  Lab 02/19/23 1840 02/19/23 1852  WBC 5.4  --   HGB 15.8 16.3  HCT 48.3 48.0  PLT 395  --    Recent Labs  Lab 02/19/23 2027  NA 137  K 4.4  CL 102  CO2 24  BUN 11  CREATININE 0.91  GLUCOSE 91  CALCIUM 9.2     EKG: sinus rhythm, prior infarct   Imaging Studies Performed: CT HEAD IMPRESSION: 1. No acute intracranial abnormality or significant interval change. 2. Stable atrophy and diffuse white matter disease. This likely reflects the sequela of chronic microvascular ischemia. 3. Aspects is 10/10.  CTA HEAD/NECK IMPRESSION: Suboptimal contrast bolus, even on delayed images, suggestive  of poor cardiac output. Within this limitation, no large vessel occlusion or significant stenosis in the head or neck.  Lance Muss, MD 02/19/2023, 11:02 PM PGY-1, [facility]  FPTS Intern pager: [phone], text pages welcome Secure chat group [facility] Teaching Service   Upper Level Addendum:  I have reviewed the above note, making necessary revisions as appropriate.  I agree with the medical decision making and physical exam by the resident as noted above.  Littie Deeds, MD PGY-3 [facility] Family Medicine Residency

## 2023-02-19 NOTE — ED Provider Notes (Signed)
[place] EMERGENCY DEPARTMENT AT [facility] Provider Note   CSN: [id number] Arrival date & time: 02/19/23  1839     History {Add pertinent medical, surgical, social history, OB history to HPI:1} No chief complaint on file.   Cameron Barton is a 89 y.o. male.  HPI Patient is for strokelike symptoms.  Medical history includes inguinal hernia s/p repair, arthritis.  Patient was at home with his son earlier today.  Son last saw him normal at approximately 12 PM.  The next time patient was seen by family was at 2:30 PM when they noticed left-sided weakness, left-sided facial droop, and slurred speech.  Patient arrives via EMS as a code stroke.  Per further discussion with family, it does seem that he has had chronic left-sided weakness over the past year.  Currently, patient denies any complaints.    Home Medications Prior to Admission medications   Medication Sig Start Date End Date Taking? Authorizing Provider  acetaminophen (TYLENOL) 500 MG tablet Take 500 mg by mouth every 6 (six) hours as needed for moderate pain.    [provider]      Allergies    Patient has no allergy information on record.    Review of Systems   Review of Systems  Neurological:  Positive for facial asymmetry, speech difficulty and weakness.  All other systems reviewed and are negative.   Physical Exam Updated Vital Signs Wt 69.2 kg   BMI 23.20 kg/m  Physical Exam Vitals and nursing note reviewed.  Constitutional:      General: He is not in acute distress.    Appearance: Normal appearance. He is well-developed. He is not ill-appearing, toxic-appearing or diaphoretic.  HENT:     Head: Normocephalic and atraumatic.     Right Ear: External ear normal.     Left Ear: External ear normal.     Nose: Nose normal.     Mouth/Throat:     Mouth: Mucous membranes are moist.  Eyes:     Conjunctiva/sclera: Conjunctivae normal.  Cardiovascular:     Rate and Rhythm: Normal rate and  regular rhythm.  Pulmonary:     Effort: Pulmonary effort is normal. No respiratory distress.  Abdominal:     General: There is no distension.     Palpations: Abdomen is soft.  Musculoskeletal:        General: No swelling or deformity.     Cervical back: Normal range of motion and neck supple.     Right lower leg: No edema.     Left lower leg: No edema.  Skin:    General: Skin is warm and dry.     Capillary Refill: Capillary refill takes less than 2 seconds.     Coloration: Skin is not jaundiced or pale.  Neurological:     Mental Status: He is alert.     Comments: ***  Psychiatric:        Mood and Affect: Mood normal.        Behavior: Behavior normal.     ED Results / Procedures / Treatments   Labs (all labs ordered are listed, but only abnormal results are displayed) Labs Reviewed  I-STAT CHEM 8, ED - Abnormal; Notable for the following components:      Result Value   Potassium 6.7 (*)    Calcium, Ion 1.00 (*)    All other components within normal limits  PROTIME-INR  APTT  CBC  DIFFERENTIAL  COMPREHENSIVE METABOLIC PANEL  CBG MONITORING, ED  EKG None  Radiology CT HEAD CODE STROKE WO CONTRAST  Result Date: 02/19/2023 CLINICAL DATA:  Code stroke. EXAM: CT HEAD WITHOUT CONTRAST TECHNIQUE: Contiguous axial images were obtained from the base of the skull through the vertex without intravenous contrast. RADIATION DOSE REDUCTION: This exam was performed according to the departmental dose-optimization program which includes automated exposure control, adjustment of the mA and/or kV according to patient size and/or use of iterative reconstruction technique. COMPARISON:  None CT head without contrast 10/04/2021 FINDINGS: Brain: No acute infarct, hemorrhage, or mass lesion is present. Moderate generalized atrophy and diffuse white matter disease is similar the prior exam. The ventricles are proportionate to the degree of atrophy. No significant extraaxial fluid collection is  present. Deep brain nuclei are within normal limits. The brainstem and cerebellum are within normal limits. Vascular: No hyperdense vessel or unexpected calcification. Skull: Calvarium is intact. No focal lytic or blastic lesions are present. No significant extracranial soft tissue lesion is present. Sinuses/Orbits: The paranasal sinuses and mastoid air cells are clear. Bilateral lens replacements are noted. Globes and orbits are otherwise unremarkable. ASPECTS [facility] Stroke Program Early CT Score) - Ganglionic level infarction (caudate, lentiform nuclei, internal capsule, insula, M1-M3 cortex): 7/7 - Supraganglionic infarction (M4-M6 cortex): 3/3 Total score (0-10 with 10 being normal): 10/10 IMPRESSION: 1. No acute intracranial abnormality or significant interval change. 2. Stable atrophy and diffuse white matter disease. This likely reflects the sequela of chronic microvascular ischemia. 3. Aspects is 10/10. The above was relayed via text pager to Dr. Rollene Fare On 02/19/2023 at 18:57 . Electronically Signed   By: Marin Roberts M.D.   On: 02/19/2023 18:57    Procedures Procedures  {Document cardiac monitor, telemetry assessment procedure when appropriate:1}  Medications Ordered in ED Medications  sodium chloride flush (NS) 0.9 % injection 3 mL (has no administration in time range)    ED Course/ Medical Decision Making/ A&P   {   Click here for ABCD2, HEART and other calculatorsREFRESH Note before signing :1}                          Medical Decision Making Amount and/or Complexity of Data Reviewed Labs: ordered. Radiology: ordered.   This patient presents to the ED for concern of ***, this involves an extensive number of treatment options, and is a complaint that carries with it a high risk of complications and morbidity.  The differential diagnosis includes ***   Co morbidities that complicate the patient evaluation  ***   Additional history obtained:  Additional history  obtained from *** External records from outside source obtained and reviewed including ***   Lab Tests:  I Ordered, and personally interpreted labs.  The pertinent results include:  ***   Imaging Studies ordered:  I ordered imaging studies including ***  I independently visualized and interpreted imaging which showed *** I agree with the radiologist interpretation   Cardiac Monitoring: / EKG:  The patient was maintained on a cardiac monitor.  I personally viewed and interpreted the cardiac monitored which showed an underlying rhythm of: ***   Consultations Obtained:  I requested consultation with the ***,  and discussed lab and imaging findings as well as pertinent plan - they recommend: ***   Problem List / ED Course / Critical interventions / Medication management  Patient presents as a code stroke.  Family noticed left-sided weakness, left facial droop, and slurred speech at 2:30 PM.  Best estimate of last  known well was 12 PM.  Per further discussion with family, chronicity of left-sided weakness is indeterminate.  Patient was taken directly to CT scanner.***. I ordered medication including ***  for ***  Reevaluation of the patient after these medicines showed that the patient {resolved/improved/worsened:23923::"improved"} I have reviewed the patients home medicines and have made adjustments as needed   Social Determinants of Health:  ***   Test / Admission - Considered:  ***   {Document critical care time when appropriate:1} {Document review of labs and clinical decision tools ie heart score, Chads2Vasc2 etc:1}  {Document your independent review of radiology images, and any outside records:1} {Document your discussion with family members, caretakers, and with consultants:1} {Document social determinants of health affecting pt's care:1} {Document your decision making why or why not admission, treatments were needed:1} Final Clinical Impression(s) / ED  Diagnoses Final diagnoses:  None    Rx / DC Orders ED Discharge Orders     None

## 2023-02-19 NOTE — ED Triage Notes (Signed)
Pt BIB [facility] from home as Code Stroke, LKN 12 seen by wife at baseline. Brother found him sitting in his chair leaning to the Lt, with Lt weakness, Lt droop & slurred speech. VSS @ 126/88, 78 bpm, 100 % on RA, CBG 115, 12L good, not on thinners. Baseline is A/Ox4 & uses rolling walker.

## 2023-02-19 NOTE — Assessment & Plan Note (Addendum)
Medically stable and awaiting SNF placement.  Neurology signed off - Continue aspirin 81mg  daily - Continue Plavix 75mg  daily for 3 weeks (last dose 6/4) - Cont rosuvastatin 20mg  daily  - Consider outpt palliative consult - Fall precaution

## 2023-02-19 NOTE — ED Notes (Signed)
ED TO INPATIENT HANDOFF REPORT  ED Nurse Name and Phone #: Grant Fontana PM [phone]  S Name/Age/Gender Cameron Barton 89 y.o. male Room/Bed: 033C/033C  Code Status   Code Status: Full Code  Home/SNF/Other Home Patient oriented to: self Is this baseline? Yes   Triage Complete: Triage complete  Chief Complaint Left-sided weakness [R53.1]  Triage Note Pt BIB [facility] from home as Code Stroke, LKN 12 seen by wife at baseline. Brother found him sitting in his chair leaning to the Lt, with Lt weakness, Lt droop & slurred speech. VSS @ 126/88, 78 bpm, 100 % on RA, CBG 115, 12L good, not on thinners. Baseline is A/Ox4 & uses rolling walker.   Allergies Not on File  Level of Care/Admitting Diagnosis ED Disposition     ED Disposition  Admit   Condition  --   Comment  Hospital Area: [facility] [[id number]]  Level of Care: Telemetry Medical [104]  May place patient in observation at [facility] or [facility] if equivalent level of care is available:: Yes  Covid Evaluation: Asymptomatic - no recent exposure (last 10 days) testing not required  Diagnosis: Left-sided weakness [454098]  Admitting Physician: Lance Muss [[id number]]  Attending Physician: Nestor Ramp [[id number]]          B Medical/Surgery History Past Medical History:  Diagnosis Date   Arthritis    Past Surgical History:  Procedure Laterality Date   HERNIA REPAIR     INGUINAL HERNIA REPAIR Left 10/02/2021   Procedure: HERNIA REPAIR INGUINAL INCARCERATED;  Surgeon: Romie Levee, MD;  Location: [facility];  Service: General;  Laterality: Left;     A IV Location/Drains/Wounds Patient Lines/Drains/Airways Status     Active Line/Drains/Airways     Name Placement date Placement time Site Days   Peripheral IV 02/19/23 20 G Left Antecubital 02/19/23  1850  Antecubital  less than 1   Incision (Closed) 10/02/21 Groin Left 10/02/21  2101  -- 505            Intake/Output Last 24  hours  Intake/Output Summary (Last 24 hours) at 02/19/2023 2247 Last data filed at 02/19/2023 2213 Gross per 24 hour  Intake 1000 ml  Output --  Net 1000 ml    Labs/Imaging Results for orders placed or performed during the hospital encounter of 02/19/23 (from the past 48 hour(s))  Protime-INR     Status: None   Collection Time: 02/19/23  6:40 PM  Result Value Ref Range   Prothrombin Time 14.6 11.4 - 15.2 seconds   INR 1.1 0.8 - 1.2    Comment: (NOTE) INR goal varies based on device and disease states. Performed at [facility], [address], [place], [place] [address]   APTT     Status: None   Collection Time: 02/19/23  6:40 PM  Result Value Ref Range   aPTT 31 24 - 36 seconds    Comment: Performed at [facility], [address], [place], [place] [address]  CBC     Status: None   Collection Time: 02/19/23  6:40 PM  Result Value Ref Range   WBC 5.4 4.0 - 10.5 K/uL   RBC 5.72 4.22 - 5.81 MIL/uL   Hemoglobin 15.8 13.0 - 17.0 g/dL   HCT 57.8 46.9 - 62.9 %   MCV 84.4 80.0 - 100.0 fL   MCH 27.6 26.0 - 34.0 pg   MCHC 32.7 30.0 - 36.0 g/dL   RDW 52.8 41.3 -  15.5 %   Platelets 395 150 - 400 K/uL   nRBC 0.0 0.0 - 0.2 %    Comment: Performed at [facility], [address], [place], [place] [address]  Differential     Status: None   Collection Time: 02/19/23  6:40 PM  Result Value Ref Range   Neutrophils Relative % 61 %   Neutro Abs 3.3 1.7 - 7.7 K/uL   Lymphocytes Relative 23 %   Lymphs Abs 1.2 0.7 - 4.0 K/uL   Monocytes Relative 14 %   Monocytes Absolute 0.8 0.1 - 1.0 K/uL   Eosinophils Relative 0 %   Eosinophils Absolute 0.0 0.0 - 0.5 K/uL   Basophils Relative 1 %   Basophils Absolute 0.1 0.0 - 0.1 K/uL   Immature Granulocytes 1 %   Abs Immature Granulocytes 0.04 0.00 - 0.07 K/uL    Comment: Performed at [facility], [address], [place], [place] [address]  CBG monitoring, ED     Status: None   Collection Time: 02/19/23  6:47 PM  Result  Value Ref Range   Glucose-Capillary 90 70 - 99 mg/dL    Comment: Glucose reference range applies only to samples taken after fasting for at least 8 hours.  I-stat chem 8, ED     Status: Abnormal   Collection Time: 02/19/23  6:52 PM  Result Value Ref Range   Sodium 137 135 - 145 mmol/L   Potassium 6.7 (HH) 3.5 - 5.1 mmol/L   Chloride 104 98 - 111 mmol/L   BUN 16 8 - 23 mg/dL   Creatinine, Ser 0.98 0.61 - 1.24 mg/dL   Glucose, Bld 91 70 - 99 mg/dL    Comment: Glucose reference range applies only to samples taken after fasting for at least 8 hours.   Calcium, Ion 1.00 (L) 1.15 - 1.40 mmol/L   TCO2 27 22 - 32 mmol/L   Hemoglobin 16.3 13.0 - 17.0 g/dL   HCT 11.9 14.7 - 82.9 %   Comment NOTIFIED PHYSICIAN   Comprehensive metabolic panel     Status: Abnormal   Collection Time: 02/19/23  8:27 PM  Result Value Ref Range   Sodium 137 135 - 145 mmol/L   Potassium 4.4 3.5 - 5.1 mmol/L   Chloride 102 98 - 111 mmol/L   CO2 24 22 - 32 mmol/L   Glucose, Bld 91 70 - 99 mg/dL    Comment: Glucose reference range applies only to samples taken after fasting for at least 8 hours.   BUN 11 8 - 23 mg/dL   Creatinine, Ser 5.62 0.61 - 1.24 mg/dL   Calcium 9.2 8.9 - 13.0 mg/dL   Total Protein 6.4 (L) 6.5 - 8.1 g/dL   Albumin 3.7 3.5 - 5.0 g/dL   AST 16 15 - 41 U/L   ALT 10 0 - 44 U/L   Alkaline Phosphatase 44 38 - 126 U/L   Total Bilirubin 0.5 0.3 - 1.2 mg/dL   GFR, Estimated >86 >57 mL/min    Comment: (NOTE) Calculated using the CKD-EPI Creatinine Equation (2021)    Anion gap 11 5 - 15    Comment: Performed at [facility], [address] [place], [place] [address]  Hemoglobin A1c     Status: None   Collection Time: 02/19/23  8:27 PM  Result Value Ref Range   Hgb A1c MFr Bld 4.9 4.8 - 5.6 %    Comment: (NOTE) Pre diabetes:  5.7%-6.4%  Diabetes:              >6.4%  Glycemic control for   <7.0% adults with diabetes    Mean Plasma Glucose 93.93 mg/dL    Comment: Performed at  [facility], [address] [place], [place] [address]  Hepatic function panel     Status: None   Collection Time: 02/19/23  8:27 PM  Result Value Ref Range   Total Protein 6.6 6.5 - 8.1 g/dL   Albumin 3.7 3.5 - 5.0 g/dL   AST 16 15 - 41 U/L   ALT 10 0 - 44 U/L   Alkaline Phosphatase 45 38 - 126 U/L   Total Bilirubin 0.5 0.3 - 1.2 mg/dL   Bilirubin, Direct 0.2 0.0 - 0.2 mg/dL   Indirect Bilirubin 0.3 0.3 - 0.9 mg/dL    Comment: Performed at [facility], [address] [place], [place] [address]  Magnesium     Status: None   Collection Time: 02/19/23  8:27 PM  Result Value Ref Range   Magnesium 1.8 1.7 - 2.4 mg/dL    Comment: Performed at [facility], [address] [place], [place] [address]   CT ANGIO HEAD NECK W WO CM (CODE STROKE)  Result Date: 02/19/2023 CLINICAL DATA:  Neuro deficit, acute, stroke suspected. EXAM: CT ANGIOGRAPHY HEAD AND NECK WITH AND WITHOUT CONTRAST TECHNIQUE: Multidetector CT imaging of the head and neck was performed using the standard protocol during bolus administration of intravenous contrast. Multiplanar CT image reconstructions and MIPs were obtained to evaluate the vascular anatomy. Carotid stenosis measurements (when applicable) are obtained utilizing NASCET criteria, using the distal internal carotid diameter as the denominator. RADIATION DOSE REDUCTION: This exam was performed according to the departmental dose-optimization program which includes automated exposure control, adjustment of the mA and/or kV according to patient size and/or use of iterative reconstruction technique. CONTRAST:  OMNIPAQUE IOHEXOL 350 MG/ML SOLN COMPARISON:  Head CT 02/19/2023. FINDINGS: CTA NECK FINDINGS Suboptimal contrast bolus, even on delayed images, suggestive of poor cardiac output. Aortic arch: Arch vessels are obscured by hang up of contrast bolus in the left brachiocephalic vein. Right carotid system: No evidence of dissection, stenosis (50% or  greater), or occlusion. Left carotid system: No evidence of dissection, stenosis (50% or greater), or occlusion. Vertebral arteries: Codominant. No evidence of dissection, stenosis (50% or greater), or occlusion. Skeleton: Multilevel cervical spondylosis without high-grade spinal canal stenosis. Other neck: Unremarkable. Upper chest: Unremarkable Review of the MIP images confirms the above findings CTA HEAD FINDINGS Anterior circulation: Intracranial ICAs are patent without stenosis. The proximal ACAs and MCAs are patent without stenosis or aneurysm. Distal branches are poorly opacified, even on delayed images. Posterior circulation: Normal basilar artery. The SCAs, AICAs and PICAs are patent proximally. The PCAs are patent proximally without stenosis or aneurysm. Distal branches are poorly opacified, even on delayed images. Venous sinuses: Patent. Anatomic variants: None. Review of the MIP images confirms the above findings IMPRESSION: Suboptimal contrast bolus, even on delayed images, suggestive of poor cardiac output. Within this limitation, no large vessel occlusion or significant stenosis in the head or neck. Electronically Signed   By: Orvan Falconer M.D.   On: 02/19/2023 19:30   CT HEAD CODE STROKE WO CONTRAST  Result Date: 02/19/2023 CLINICAL DATA:  Code stroke. EXAM: CT HEAD WITHOUT CONTRAST TECHNIQUE: Contiguous axial images were obtained from the base of the skull through the vertex without intravenous contrast. RADIATION DOSE REDUCTION:  This exam was performed according to the departmental dose-optimization program which includes automated exposure control, adjustment of the mA and/or kV according to patient size and/or use of iterative reconstruction technique. COMPARISON:  None CT head without contrast 10/04/2021 FINDINGS: Brain: No acute infarct, hemorrhage, or mass lesion is present. Moderate generalized atrophy and diffuse white matter disease is similar the prior exam. The ventricles are  proportionate to the degree of atrophy. No significant extraaxial fluid collection is present. Deep brain nuclei are within normal limits. The brainstem and cerebellum are within normal limits. Vascular: No hyperdense vessel or unexpected calcification. Skull: Calvarium is intact. No focal lytic or blastic lesions are present. No significant extracranial soft tissue lesion is present. Sinuses/Orbits: The paranasal sinuses and mastoid air cells are clear. Bilateral lens replacements are noted. Globes and orbits are otherwise unremarkable. ASPECTS [facility] Stroke Program Early CT Score) - Ganglionic level infarction (caudate, lentiform nuclei, internal capsule, insula, M1-M3 cortex): 7/7 - Supraganglionic infarction (M4-M6 cortex): 3/3 Total score (0-10 with 10 being normal): 10/10 IMPRESSION: 1. No acute intracranial abnormality or significant interval change. 2. Stable atrophy and diffuse white matter disease. This likely reflects the sequela of chronic microvascular ischemia. 3. Aspects is 10/10. The above was relayed via text pager to Dr. Rollene Fare On 02/19/2023 at 18:57 . Electronically Signed   By: Marin Roberts M.D.   On: 02/19/2023 18:57    Pending Labs Unresulted Labs (From admission, onward)     Start     Ordered   02/20/23 0500  Lipid panel  (Labs)  Tomorrow morning,   R       Comments: Fasting    02/19/23 2003   02/19/23 2045  Ethanol  Once,   R        02/19/23 2045            Vitals/Pain Today's Vitals   02/19/23 1917 02/19/23 1930 02/19/23 2000 02/19/23 2015  BP: 136/87 135/79 (!) 150/90   Pulse:  66 73   Resp: 15 (!) 24 16   SpO2:  100% 100%   Weight:    69.2 kg  Height:    5\' 8"  (1.727 m)  PainSc:        Isolation Precautions No active isolations  Medications Medications   stroke: early stages of recovery book (has no administration in time range)  aspirin EC tablet 81 mg (81 mg Oral Given 02/19/23 2019)  clopidogrel (PLAVIX) tablet 300 mg (300 mg Oral Given  02/19/23 2019)    Followed by  clopidogrel (PLAVIX) tablet 75 mg (has no administration in time range)  enoxaparin (LOVENOX) injection 40 mg (has no administration in time range)  acetaminophen (TYLENOL) tablet 650 mg (has no administration in time range)    Or  acetaminophen (TYLENOL) suppository 650 mg (has no administration in time range)  sodium chloride flush (NS) 0.9 % injection 3 mL (3 mLs Intravenous Given 02/19/23 1907)  sodium chloride 0.9 % bolus 1,000 mL (0 mLs Intravenous Stopped 02/19/23 2213)  iohexol (OMNIPAQUE) 350 MG/ML injection 125 mL (125 mLs Intravenous Contrast Given 02/19/23 1914)    Mobility manual wheelchair     Focused Assessments Neuro Assessment Handoff:  Swallow screen pass? Yes    NIH Stroke Scale  Dizziness Present: No Headache Present: No Interval: Other (Comment) Level of Consciousness (1a.)   : Alert, keenly responsive LOC Questions (1b. )   : Answers neither question correctly LOC Commands (1c. )   : Performs one task correctly Best Gaze (  2. )  : Normal Visual (3. )  : No visual loss Facial Palsy (4. )    : Normal symmetrical movements Motor Arm, Left (5a. )   : No drift Motor Arm, Right (5b. ) : No drift Motor Leg, Left (6a. )  : Drift Motor Leg, Right (6b. ) : No drift Limb Ataxia (7. ): Present in one limb Sensory (8. )  : Normal, no sensory loss Best Language (9. )  : No aphasia Dysarthria (10. ): Normal Extinction/Inattention (11.)   : Visual/tactile/auditory/spatial/personal inattention Complete NIHSS TOTAL: 6 Last date known well: 02/19/23 Last time known well: 1400 Neuro Assessment:   Neuro Checks:   Initial (02/19/23 1900)  Has TPA been given? No If patient is a Neuro Trauma and patient is going to OR before floor call report to 4N Charge nurse: [phone] or [phone]   R Recommendations: See Admitting Provider Note  Report given to:   Additional Notes: Patient awaiting MRI and EEG.

## 2023-02-19 NOTE — Consult Note (Signed)
Neurology Consultation Reason for Consult: Code stroke Requesting Physician: Gloris Manchester  CC: Worsening left-sided weakness  History is obtained from: Sister and caregiver at bedside, very minimal information in chart review  HPI: Cameron Barton is a 89 y.o. male with a past medical history significant for dementia and pre-existing left-sided weakness for about the past year who presents with fluctuating left-sided weakness today  He was in his usual state of health earlier in the day, had eaten lunch normally and had ambulated to the porch and back to his room at around 2:30 PM.  Subsequently at 3:30 PM he was found to be quite confused and having difficulty moving the left side, completely plegic in the left upper extremity which is unusual for him.  Symptoms continue to fluctuate with EMS and on patient's arrival to the ED.  Family does not feel that symptoms have fluctuated like this in the past.  No focal shaking or twitching was observed.  They deny any other recent complaints on review of systems other than intermittent headaches  At baseline he uses a rolling walker, needs help with bathing and dressing, but feeds himself.  Sister is very concerned about arranging placement for him as she feels his needs are out stripping the ability of his caregiver to provide  No seizure risk factors:  Normal birth and development No febrile seizures in childhood Significant head trauma No intracranial surgeries Mengingitis/Encephalitis history No family history of seizures    LKW: 2:30 PM Thrombolytic given?  No, out of the window IA performed?: No, no clear LVO Premorbid modified rankin scale:      4 - Moderately severe disability. Unable to attend to own bodily needs without assistance, and unable to walk unassisted.  ROS:  Unable to obtain due to altered mental status.   Past Medical History:  Diagnosis Date   Arthritis    Past Surgical History:  Procedure Laterality Date   HERNIA  REPAIR     INGUINAL HERNIA REPAIR Left 10/02/2021   Procedure: HERNIA REPAIR INGUINAL INCARCERATED;  Surgeon: Romie Levee, MD;  Location: [facility];  Service: General;  Laterality: Left;   No current facility-administered medications on file prior to encounter.   Current Outpatient Medications on File Prior to Encounter  Medication Sig Dispense Refill   acetaminophen (TYLENOL) 500 MG tablet Take 500 mg by mouth every 6 (six) hours as needed for moderate pain.    -He takes Mucinex for allergies but no prescription medications  No family history on file.   Social History:  reports that he has quit smoking. He has never used smokeless tobacco. He reports that he does not drink alcohol and does not use drugs.  Exam: Current vital signs: There were no vitals taken for this visit. Vital signs in last 24 hours:     Physical Exam  Constitutional: Appears well-developed and well-nourished.  Psych: Affect pleasant and cooperative Eyes: No scleral injection HENT: No oropharyngeal obstruction.  MSK: no joint deformities.  Cardiovascular: Normal rate and regular rhythm. Perfusing extremities well Respiratory: Effort normal, non-labored breathing GI: Soft.  No distension. There is no tenderness.  Skin: Warm dry and intact visible skin  Neuro: Mental Status: Patient is awake, alert, oriented to person, but not age or month initially, and later does report these correctly although sister at bedside reports age in chart is inaccurate No signs of aphasia but intermittently appears to have some mild neglect of the left side  Cranial Nerves: II: Visual Fields are notable  for intermittent difficulty with the left visual Fields. III,IV, VI: Intermittent right gaze preference but able to look fully to the left and up and down V: Facial sensation is symmetric to light touch VII: Facial movement is notable for an intermittent left facial droop VIII: hearing is intact to voice X: Uvula elevates  symmetrically XII: tongue is midline without atrophy or fasciculations.  Motor: Exam is very variable, at times he has almost no movement in the left upper and lower extremity, other times he is able to maintain it antigravity without drift.  No variability on the right side Sensory: Sensation is symmetric to light touch and temperature in the arms and legs Deep Tendon Reflexes: 2+ and symmetric in the patella Cerebellar: FNF are intact within limits of weakness.  Heel-to-shin mildly dysmetric bilaterally Gait:  Deferred for safety given fluctuating weakness  NIHSS total 14 -> 3 (variable and fluctuating)  I have reviewed labs in epic and the results pertinent to this consultation are:  Basic Metabolic Panel: Recent Labs  Lab 02/19/23 1852  NA 137  K 6.7*  CL 104  GLUCOSE 91  BUN 16  CREATININE 0.90    CBC: Recent Labs  Lab 02/19/23 1840 02/19/23 1852  WBC 5.4  --   NEUTROABS 3.3  --   HGB 15.8 16.3  HCT 48.3 48.0  MCV 84.4  --   PLT 395  --     Coagulation Studies: Recent Labs    02/19/23 1840  LABPROT 14.6  INR 1.1      I have reviewed the images obtained:  Head CT personally reviewed, agree with radiology no acute intracranial process  CTA personally reviewed, agree with radiology no emergent LVO or significant stenosis within the limits of poor contrast timing despite 2 attempts; unclear why appropriate contrast timing could not be obtained   Impression: Fluctuating left-sided weakness concerning for stuttering lacunar stroke symptoms although given longstanding history of left-sided weakness and a patient with dementia this may also reflect a dementia related cognitive fluctuation or less likely seizure activity.   Recommendations: # Possible stuttering lacunar stroke - Stroke labs HgbA1c, fasting lipid panel - MRI brain with and without contrast, MRA head - Frequent neuro checks - Echocardiogram - Aspirin 81 mg daily - Plavix 300 mg load with  75 mg daily for 21 - 90 day course pending further vessel imaging - Risk factor modification - Telemetry monitoring  - Blood pressure goal   - Permissive hypertension to 220/120 due to possible acute stroke - PT consult, OT consult, Speech consult - Stroke team to follow - Routine EEG  Brooke Dare MD-PhD [facility] [phone] Available 7 AM to 7 PM, outside these hours please contact Neurologist on call listed on AMION   CRITICAL CARE Performed by: Gordy Councilman   Total critical care time: 60 minutes  Critical care time was exclusive of separately billable procedures and treating other patients.  Critical care was necessary to treat or prevent imminent or life-threatening deterioration, emergent evaluation for consideration of thrombolytic or thrombectomy.  Critical care was time spent personally by me on the following activities: development of treatment plan with patient and/or surrogate as well as nursing, discussions with consultants, evaluation of patient's response to treatment, examination of patient, obtaining history from patient or surrogate, ordering and performing treatments and interventions, ordering and review of laboratory studies, ordering and review of radiographic studies, pulse oximetry and re-evaluation of patient's condition.

## 2023-02-19 NOTE — ED Notes (Signed)
Pt daughter Donalynn Furlong called for a update her number is in the chart

## 2023-02-19 NOTE — Assessment & Plan Note (Deleted)
Patient presents with fluctuating left-sided weakness which started around 3:30 PM today.  Patient has a history of headaches and falls.  No palpitations or chest pain. On exam patient has left-sided facial droop and upper extremity weakness.  Patient's speech is difficult under stand however his sister reports this is his baseline speech.  Creatinine was at baseline (0.9) and EKG showed sinus rhythm.   CT head showed no acute intracranial abnormality.  CTA head and neck had suboptimal contrast bolus and within this limitation does not show large vessel occlusion or significant stenosis in the head or neck.  At this time it is possible that patient had lacunar stroke.  Neurology on board.  Will obtain MRI brain and MRA head and neck to clarify.  Appreciate their recommendation. -Admit to progressive with FPTS, attending Dr. Deirdre Priest -Neurology consulted in the ER, appreciate recs -MRA head and MRI brain pending -Echo pending -EEG pending -Aspirin 81 mg daily -Plavix 300 mg load with 75 mg daily for 21 - 90 day course pending further vessel imaging -Continuous cardiac monitoring -PT/OT eval and treat -SLP eval -Neurochecks Q2H -Fall precaution -Placed on NPO -Continue routine vitals -SCDs for VTE prophylaxis -Risk stratify with lipid panel, TSH

## 2023-02-19 NOTE — Progress Notes (Signed)
Patient would not hold still for exam.  Patient demanded to be removed from MRI scanner.  RN informed that patient refused to complete MRI.

## 2023-02-19 NOTE — Progress Notes (Signed)
EEG complete - results pending 

## 2023-02-19 NOTE — Code Documentation (Signed)
Stroke Response Nurse Documentation Code Documentation  Cameron Barton is a 89 y.o. male arriving to [facility]  via [facility] on 5/13 with past medical hx of dementia. On No antithrombotic. Code stroke was activated by EMS.   Patient from home where he was LKW at 1400 and now complaining of facial droop, weakness, and slurred speech. Story is continuing to fluctuate but EMS reports patient was last seen at 1230 and then found at 1430 with slurred speech, L sided weakness, and facial droop. Family reports has had L sided weakness x 2 years and but that his weakness is worse than normal today.    Stroke team at the bedside on patient arrival. Labs drawn and patient cleared for CT by Dr. Posey Rea. Patient to CT with team. NIHSS 8, though fluctuating multiple times throughout the time with the patient. See documentation for details and code stroke times. Patient with disoriented, left gaze preference , left facial droop, left leg weakness, dysarthria , and Visual  neglect on exam. The following imaging was completed:  CT Head and CTA. Patient is not a candidate for IV Thrombolytic due to being out of the TNK window. Patient is not not a candidate for IR due to no LVO on imaging per MD.   Care Plan: q2 hour neuro checks, MRI.   Bedside handoff with ED paramedic Lyda Perone.    Pearlie Oyster  Stroke Response RN

## 2023-02-20 ENCOUNTER — Observation Stay (HOSPITAL_COMMUNITY): Payer: Medicare Other

## 2023-02-20 ENCOUNTER — Observation Stay (HOSPITAL_BASED_OUTPATIENT_CLINIC_OR_DEPARTMENT_OTHER): Payer: Medicare Other

## 2023-02-20 DIAGNOSIS — I63511 Cerebral infarction due to unspecified occlusion or stenosis of right middle cerebral artery: Secondary | ICD-10-CM | POA: Diagnosis present

## 2023-02-20 DIAGNOSIS — Z7982 Long term (current) use of aspirin: Secondary | ICD-10-CM | POA: Diagnosis not present

## 2023-02-20 DIAGNOSIS — R4701 Aphasia: Secondary | ICD-10-CM | POA: Diagnosis present

## 2023-02-20 DIAGNOSIS — R29708 NIHSS score 8: Secondary | ICD-10-CM | POA: Diagnosis present

## 2023-02-20 DIAGNOSIS — Z515 Encounter for palliative care: Secondary | ICD-10-CM | POA: Diagnosis not present

## 2023-02-20 DIAGNOSIS — K219 Gastro-esophageal reflux disease without esophagitis: Secondary | ICD-10-CM | POA: Diagnosis present

## 2023-02-20 DIAGNOSIS — F0390 Unspecified dementia without behavioral disturbance: Secondary | ICD-10-CM | POA: Diagnosis not present

## 2023-02-20 DIAGNOSIS — Z9181 History of falling: Secondary | ICD-10-CM | POA: Diagnosis not present

## 2023-02-20 DIAGNOSIS — I639 Cerebral infarction, unspecified: Secondary | ICD-10-CM | POA: Diagnosis present

## 2023-02-20 DIAGNOSIS — I6389 Other cerebral infarction: Secondary | ICD-10-CM | POA: Diagnosis not present

## 2023-02-20 DIAGNOSIS — Z66 Do not resuscitate: Secondary | ICD-10-CM | POA: Diagnosis present

## 2023-02-20 DIAGNOSIS — Z79899 Other long term (current) drug therapy: Secondary | ICD-10-CM | POA: Diagnosis not present

## 2023-02-20 DIAGNOSIS — I6381 Other cerebral infarction due to occlusion or stenosis of small artery: Secondary | ICD-10-CM | POA: Diagnosis present

## 2023-02-20 DIAGNOSIS — G8194 Hemiplegia, unspecified affecting left nondominant side: Secondary | ICD-10-CM | POA: Diagnosis present

## 2023-02-20 DIAGNOSIS — W06XXXA Fall from bed, initial encounter: Secondary | ICD-10-CM | POA: Diagnosis present

## 2023-02-20 DIAGNOSIS — F05 Delirium due to known physiological condition: Secondary | ICD-10-CM | POA: Diagnosis not present

## 2023-02-20 DIAGNOSIS — R569 Unspecified convulsions: Secondary | ICD-10-CM | POA: Diagnosis not present

## 2023-02-20 DIAGNOSIS — R531 Weakness: Secondary | ICD-10-CM | POA: Diagnosis not present

## 2023-02-20 DIAGNOSIS — R410 Disorientation, unspecified: Secondary | ICD-10-CM | POA: Insufficient documentation

## 2023-02-20 DIAGNOSIS — F03911 Unspecified dementia, unspecified severity, with agitation: Secondary | ICD-10-CM | POA: Diagnosis not present

## 2023-02-20 DIAGNOSIS — E785 Hyperlipidemia, unspecified: Secondary | ICD-10-CM | POA: Diagnosis present

## 2023-02-20 DIAGNOSIS — I10 Essential (primary) hypertension: Secondary | ICD-10-CM | POA: Diagnosis present

## 2023-02-20 DIAGNOSIS — Z751 Person awaiting admission to adequate facility elsewhere: Secondary | ICD-10-CM | POA: Diagnosis not present

## 2023-02-20 DIAGNOSIS — Z87891 Personal history of nicotine dependence: Secondary | ICD-10-CM | POA: Diagnosis not present

## 2023-02-20 DIAGNOSIS — R29810 Facial weakness: Secondary | ICD-10-CM | POA: Diagnosis present

## 2023-02-20 DIAGNOSIS — Z8673 Personal history of transient ischemic attack (TIA), and cerebral infarction without residual deficits: Secondary | ICD-10-CM | POA: Diagnosis not present

## 2023-02-20 LAB — LIPID PANEL
Cholesterol: 151 mg/dL (ref 0–200)
HDL: 66 mg/dL (ref >40)
LDL Cholesterol: 78 mg/dL (ref 0–99)
Total CHOL/HDL Ratio: 2.3 RATIO
Triglycerides: 35 mg/dL (ref <150)
VLDL: 7 mg/dL (ref 0–40)

## 2023-02-20 LAB — ECHOCARDIOGRAM COMPLETE
AR max vel: 3.4 cm2
AV Area VTI: 4.04 cm2
AV Area mean vel: 2.89 cm2
AV Mean grad: 3 mmHg
AV Peak grad: 4.9 mmHg
Ao pk vel: 1.11 m/s
Area-P 1/2: 3.15 cm2
Calc EF: 58.6 %
Height: 68 in
S' Lateral: 2.3 cm
Single Plane A2C EF: 60.3 %
Single Plane A4C EF: 56.2 %
Weight: 2440.93 oz

## 2023-02-20 LAB — ETHANOL: Alcohol, Ethyl (B): <10 mg/dL (ref <10)

## 2023-02-20 MED ORDER — QUETIAPINE FUMARATE 25 MG PO TABS: 25.0000 mg | ORAL_TABLET | Freq: Every day | ORAL | Status: DC

## 2023-02-20 MED ORDER — ROSUVASTATIN CALCIUM 20 MG PO TABS
20.0000 mg | ORAL_TABLET | Freq: Every day | ORAL | Status: DC
  Administered 2023-02-20 – 2023-02-26 (×7): 20 mg via ORAL
  Filled 2023-02-20 (×7): qty 1

## 2023-02-20 NOTE — ED Notes (Signed)
Patient medicated for MRI.

## 2023-02-20 NOTE — Procedures (Signed)
Patient Name: Cameron Barton  MRN: [id number]  Epilepsy Attending: [name]  Referring Physician/Provider: Gordy Councilman, MD  Date: 02/20/2023 Duration: 22.42 mins  Patient history: 89 y.o. male presenting with left-sided weakness getting eeg to evaluate for seizure.  Level of alertness: Awake, asleep  AEDs during EEG study: None  Technical aspects: This EEG study was done with scalp electrodes positioned according to the 10-20 International system of electrode placement. Electrical activity was reviewed with band pass filter of 1-70Hz , sensitivity of 7 uV/mm, display speed of 22mm/sec with a 60Hz  notched filter applied as appropriate. EEG data were recorded continuously and digitally stored.  Video monitoring was available and reviewed as appropriate.  Description: The posterior dominant rhythm consists of 8-9 Hz activity of moderate voltage (25-35 uV) seen predominantly in posterior head regions, symmetric and reactive to eye opening and eye closing. Sleep was characterized by vertex waves, sleep spindles (12 to 14 Hz), maximal frontocentral region. EEG showed continuous 3 to 6 Hz theta-delta slowing in right temporal region. Physiologic photic driving was not seen during photic stimulation.  Hyperventilation was not performed.     ABNORMALITY - Continuous slow, right temporal region  IMPRESSION: This study is suggestive of cortical dysfunction arising from right temporal region likely secondary to underlying structural abnormality. No seizures or epileptiform discharges were seen throughout the recording.  Ignace Mandigo Annabelle Harman

## 2023-02-20 NOTE — Assessment & Plan Note (Addendum)
Stable.  ANO x 3. - Delirium precaution

## 2023-02-20 NOTE — ED Notes (Signed)
Patient keeps pulling cords off and trying to get up. Redirected him, and asked tech to help me put a posey under him and fix his bed. He still keeps pulling wires, asked for a sitter or a virtual one.

## 2023-02-20 NOTE — Progress Notes (Signed)
Daily Progress Note Intern Pager: [phone]  Patient name: Cameron Barton Medical record number: [id number] Date of birth: [birth year] Age: 89 y.o. Gender: male  Primary Care Provider: System, Provider Not In Consultants: Neurology Code Status: Full  Pt Overview and Major Events to Date:  5/13 Admitted  Assessment and Plan: AD is a 89yo M admitted for ischemic stroke of R basal ganglia and R temporal lobe.   Pertinent PMH/PSH includes hernia repair.   * Left-sided weakness MRI c/w infarct of R basal ganglia and temporal lobe. A1c and lipid panel unremarkable.  -Neurology consulted, appreciate recs -Continue aspirin 81mg  daily -Continue Plavix 75mg  daily.  - Tentatively planning for 21-90 days of plavix, will f/u Neuro recs for duration of DAPT. -Start rosuvastatin 20mg  daily for secondary prevention -f/u echo and EEG -Continuous cardiac monitoring -PT/OT eval and treat -SLP eval -Neurochecks Q2H -Fall precaution  Delirium Had episode of agitation/delirium ON where he was trying to get out of bed. Improved w/ telesitter. Suspect a component of sundowning in setting of dementia and delirium in setting of acute illness and hospitalization.  - If agitation persists, consider starting nightly Seroquel 25mg . Avoid ativan if possible.  - Delirium precaution   FEN/GI: Regular PPx: Lovenox Dispo:Pending PT recommendations . Requires hospitalization for further stroke workup and management.   Subjective:  NAEO. When asked, he says that he lives alone   Objective: Temp:  [97.6 F (36.4 C)-98.5 F (36.9 C)] 97.7 F (36.5 C) (05/14 0725) Pulse Rate:  [45-75] 51 (05/14 0700) Resp:  [15-24] 18 (05/14 0700) BP: (129-150)/(76-90) 129/85 (05/14 0643) SpO2:  [93 %-100 %] 95 % (05/14 0700) Weight:  [69.2 kg] 69.2 kg (05/13 2015)  Physical Exam: General: Pleasantly demented, older man laying comfortably in bed. Alert, talking in slurred speech. HEENT: L sided mouth  drooping Neuro: 4/5 strength in L UE and L LE. 5/5 strength in R UE and R LE. Hearing greater in R ear compared to L ear. Sensation grossly intact and symmetric.  Cardiovascular: RRR, no murmurs Respiratory: CTAB. Normal WOB on RA.  Abdomen: Soft, nondistended, nontender. Normal BS.  Skin: Warm, well perfused.   Laboratory: Most recent CBC Lab Results  Component Value Date   WBC 5.4 02/19/2023   HGB 16.3 02/19/2023   HCT 48.0 02/19/2023   MCV 84.4 02/19/2023   PLT 395 02/19/2023   Most recent BMP    Latest Ref Rng & Units 02/19/2023    8:27 PM  BMP  Glucose 70 - 99 mg/dL 91   BUN 8 - 23 mg/dL 11   Creatinine 3.08 - 1.24 mg/dL 6.57   Sodium 846 - 962 mmol/L 137   Potassium 3.5 - 5.1 mmol/L 4.4   Chloride 98 - 111 mmol/L 102   CO2 22 - 32 mmol/L 24   Calcium 8.9 - 10.3 mg/dL 9.2    Imaging/Diagnostic Tests:  MRI HEAD IMPRESSION:   1. Limited and motion degraded exam. 2. Patchy acute ischemic infarcts involving the right basal ganglia and right temporal lobe as above. No significant regional mass effect. 3. Underlying age-related cerebral atrophy with moderate chronic small vessel ischemic disease, with a few scattered remote bilateral cerebellar infarcts. 4. 1.3 cm probable meningioma overlying the parasagittal anterior right frontal convexity. No associated edema or mass effect.   MRA HEAD IMPRESSION:   1. Motion degraded exam. 2. Negative intracranial MRA for large vessel occlusion. 3. Atheromatous irregularity with up to moderate stenosis about the mid-distal right M1 segment.  4. No other hemodynamically significant or correctable stenosis.   Lincoln Brigham, MD 02/20/2023, 9:31 AM  PGY-1, [facility] FPTS Intern pager: [phone], text pages welcome Secure chat group [facility] Teaching Service

## 2023-02-20 NOTE — ED Notes (Signed)
Helped patient use the urinal, and told him we want him staying in bed because he is weak.

## 2023-02-20 NOTE — Progress Notes (Signed)
Attempted to call sister Gillian Shields twice and friend Corena Pilgrim once, was unable to reach anyone. I left a VM that we will re-attempt to call them later.   Was hoping to get more collateral on pt's neurologic baseline and home situation (who takes care of him, dispo planning). Was also planning to discuss consulting Palliative given pt age, acute illness, and Full code status.

## 2023-02-20 NOTE — Progress Notes (Signed)
Echocardiogram 2D Echocardiogram has been performed.  Cameron Barton 02/20/2023, 3:04 PM

## 2023-02-20 NOTE — ED Notes (Signed)
Got a tele sitter to ensure patient does not get up or rip off leads, or pull ambu bags off the wall.

## 2023-02-20 NOTE — Progress Notes (Signed)
Notified by nursing that pt was agitated and trying to get out of bed. Went to see pt, he had been given a cookie and was contently laying in bed eating cookie, pleasant affect. Will hold off on prn Seroquel for now. - If agitated, try to redirect first (he likes cookies) - If uanble to redirect, can try seroquel 25mg 

## 2023-02-20 NOTE — Progress Notes (Signed)
? ?  Inpatient Rehab Admissions Coordinator : ? ?Per therapy recommendations, patient was screened for CIR candidacy by Quynn Vilchis RN MSN.  At this time patient appears to be a potential candidate for CIR. I will place a rehab consult per protocol for full assessment. Please call me with any questions. ? ?Omero Kowal RN MSN ?Admissions Coordinator ?[phone] ?  ?

## 2023-02-20 NOTE — Progress Notes (Signed)
Inpatient Rehabilitation Admissions Coordinator   Noted by therapy that family requesting long term care. We will not pursue CIR at this time.  Ottie Glazier, RN, MSN Rehab Admissions Coordinator [phone] 02/20/2023 1:33 PM

## 2023-02-20 NOTE — Hospital Course (Addendum)
Cameron Barton is a 89 y.o.male with a history of CVA in past who was admitted to the [facility] Teaching Service at [facility] for Acute Ischemic Stroke. His hospital course is detailed below:  Acute Ischemic Stroke  Pt presented to ED after falling off bed due to L sided weakness. Initial CT head showed no acute abnormality. MRI showed acute infarct in right basal ganglia and right temporal lobe, and 1.3cm probable meningioma overlying the parasagittal anterior right frontal convexity. MRA head showed moderate stenosis of right M1 segment. Neurology consulted. Started DAPT with ASA and Plavix for 3 weeks, then transition to ASA monotherapy indefinitely (last dose Plavix 03/13/2023). Pt started on Crestor for secondary prevention. PT evaluation recommended SNF placement. Pt was discharge to SNF and has outpt followup with Stroke clinic 4 weeks after hospitalization.   Goals of Care Pt was initially Full code on admission. Discussed goals of care with HPOA [facility] Raul Del) and decision was made to transition pt to DNR/DNI.  Intermittent Delirium  Dementia  Chronically demented at baseline and had some episodes of delirium during admission. Pt was redirectable and did not require prn medications for delirium during hospitalization.  Other chronic conditions were medically managed with home medications and formulary alternatives as necessary.  PCP Follow-up Recommendations:  Please send Cardiology referral for outpatient 30-day cardiac monitoring to workup potential Afib as cause of stroke.  Ensure Neurology f/ 4 weeks after hospitalization Consider outpatient GOC discussion and/or Palliative consult given acute progression of neurologic decline and new DNR/DNI status. Please ensure that pt stops Plavix after 6/4. He will continue ASA indefinitely.

## 2023-02-20 NOTE — NC FL2 (Signed)
  [place] MEDICAID FL2 LEVEL OF CARE FORM     IDENTIFICATION  Patient Name: Cameron Barton Birthdate: 02/05/1929 Sex: male Admission Date (Current Location): 02/19/2023  [facility] and [place] Number:  Producer, television/film/video and Address:  [facility] [place], [address] [place], [place] [address]      Provider Number: [id number]  Attending Physician Name and Address:  Carney Living, MD  Relative Name and Phone Number:       Current Level of Care: Hospital Recommended Level of Care: Skilled Nursing Facility Prior Approval Number:    Date Approved/Denied:   PASRR Number: [id number]  Discharge Plan: SNF    Current Diagnoses: Patient Active Problem List   Diagnosis Date Noted   Delirium 02/20/2023   CVA (cerebral vascular accident) (HCC) 02/20/2023   Left-sided weakness 02/19/2023   Inguinal hernia of left side with obstruction 10/02/2021   Hernia, inguinal 10/02/2021    Orientation RESPIRATION BLADDER Height & Weight     Self  Normal Incontinent Weight: 152 lb 8.9 oz (69.2 kg) Height:  5\' 8"  (172.7 cm)  BEHAVIORAL SYMPTOMS/MOOD NEUROLOGICAL BOWEL NUTRITION STATUS      Incontinent Diet (regular)  AMBULATORY STATUS COMMUNICATION OF NEEDS Skin   Extensive Assist Verbally Normal                       Personal Care Assistance Level of Assistance  Bathing, Feeding, Dressing Bathing Assistance: Maximum assistance Feeding assistance: Limited assistance Dressing Assistance: Maximum assistance     Functional Limitations Info  Speech     Speech Info: Impaired    SPECIAL CARE FACTORS FREQUENCY  PT (By licensed PT), OT (By licensed OT)     PT Frequency: 5x/wk OT Frequency: 5x/wk            Contractures Contractures Info: Not present    Additional Factors Info  Code Status, Allergies Code Status Info: Full Allergies Info: NKA           Current Medications (02/20/2023):  This is the current hospital active medication  list Current Facility-Administered Medications  Medication Dose Route Frequency Provider Last Rate Last Admin   acetaminophen (TYLENOL) tablet 650 mg  650 mg Oral Q6H PRN Littie Deeds, MD       Or   acetaminophen (TYLENOL) suppository 650 mg  650 mg Rectal Q6H PRN Littie Deeds, MD       aspirin EC tablet 81 mg  81 mg Oral Daily Littie Deeds, MD   81 mg at 02/20/23 1048   clopidogrel (PLAVIX) tablet 75 mg  75 mg Oral Daily Littie Deeds, MD   75 mg at 02/20/23 1048   enoxaparin (LOVENOX) injection 40 mg  40 mg Subcutaneous Q24H Littie Deeds, MD   40 mg at 02/20/23 1048   rosuvastatin (CRESTOR) tablet 20 mg  20 mg Oral Daily Marvel Plan, MD   20 mg at 02/20/23 1048     Discharge Medications: Please see discharge summary for a list of discharge medications.  Relevant Imaging Results:  Relevant Lab Results:   Additional Information SS#: [id number]  Baldemar Lenis, LCSW

## 2023-02-20 NOTE — Progress Notes (Signed)
BLE venous duplex has been completed.    Results can be found under chart review under CV PROC. 02/20/2023 8:39 AM Ketih Goodie RVT, RDMS

## 2023-02-20 NOTE — Progress Notes (Addendum)
STROKE TEAM PROGRESS NOTE   INTERVAL HISTORY No family at the bedside.  He is sitting in the stretcher in no apparent distress.  He is pleasantly confused  MRI brain with patchy acute ischemic infarcts in right basal ganglia and right temporal lobe.  Probable meningioma on right frontal convexity  Vitals:   02/20/23 0725 02/20/23 1007 02/20/23 1200 02/20/23 1300  BP:  124/66 118/75 128/84  Pulse:  69    Resp:  (!) 25 (!) 24   Temp: 97.7 F (36.5 C) 97.8 F (36.6 C)    TempSrc: Oral Oral    SpO2:  100%    Weight:      Height:       CBC:  Recent Labs  Lab 02/19/23 1840 02/19/23 1852  WBC 5.4  --   NEUTROABS 3.3  --   HGB 15.8 16.3  HCT 48.3 48.0  MCV 84.4  --   PLT 395  --    Basic Metabolic Panel:  Recent Labs  Lab 02/19/23 1852 02/19/23 2027  NA 137 137  K 6.7* 4.4  CL 104 102  CO2  --  24  GLUCOSE 91 91  BUN 16 11  CREATININE 0.90 0.91  CALCIUM  --  9.2  MG  --  1.8   Lipid Panel:  Recent Labs  Lab 02/20/23 0153  CHOL 151  TRIG 35  HDL 66  CHOLHDL 2.3  VLDL 7  LDLCALC 78   HgbA1c:  Recent Labs  Lab 02/19/23 2027  HGBA1C 4.9   Urine Drug Screen: No results for input(s): "LABOPIA", "COCAINSCRNUR", "LABBENZ", "AMPHETMU", "THCU", "LABBARB" in the last 168 hours.  Alcohol Level  Recent Labs  Lab 02/20/23 0153  ETH <10    IMAGING past 24 hours VAS [place] LOWER EXTREMITY VENOUS (DVT)  Result Date: 02/20/2023  Lower Venous DVT Study Patient Name:  Cameron Barton  Date of Exam:   02/20/2023 Medical Rec #: [id number]       Accession #:    [id number] Date of Birth: 1928-12-13       Patient Gender: M Patient Age:   89 years Exam Location:  [facility] Procedure:      VAS [place] LOWER EXTREMITY VENOUS (DVT) Referring Phys: Scheryl Marten Treyshaun Keatts --------------------------------------------------------------------------------  Indications: Stroke.  Comparison Study: No previous exams Performing Technologist: Jody Hill RVT, RDMS  Examination Guidelines: A complete  evaluation includes B-mode imaging, spectral Doppler, color Doppler, and power Doppler as needed of all accessible portions of each vessel. Bilateral testing is considered an integral part of a complete examination. Limited examinations for reoccurring indications may be performed as noted. The reflux portion of the exam is performed with the patient in reverse Trendelenburg.  +---------+---------------+---------+-----------+----------+--------------+ RIGHT    CompressibilityPhasicitySpontaneityPropertiesThrombus Aging +---------+---------------+---------+-----------+----------+--------------+ CFV      Full           Yes      Yes                                 +---------+---------------+---------+-----------+----------+--------------+ SFJ      Full                                                        +---------+---------------+---------+-----------+----------+--------------+ FV Prox  Full  Yes      Yes                                 +---------+---------------+---------+-----------+----------+--------------+ FV Mid   Full           Yes      Yes                                 +---------+---------------+---------+-----------+----------+--------------+ FV DistalFull           Yes      Yes                                 +---------+---------------+---------+-----------+----------+--------------+ PFV      Full                                                        +---------+---------------+---------+-----------+----------+--------------+ POP      Full           Yes      Yes                                 +---------+---------------+---------+-----------+----------+--------------+ PTV      Full                                                        +---------+---------------+---------+-----------+----------+--------------+ PERO     Full                                                         +---------+---------------+---------+-----------+----------+--------------+   +---------+---------------+---------+-----------+----------+--------------+ LEFT     CompressibilityPhasicitySpontaneityPropertiesThrombus Aging +---------+---------------+---------+-----------+----------+--------------+ CFV      Full           Yes      Yes                                 +---------+---------------+---------+-----------+----------+--------------+ SFJ      Full                                                        +---------+---------------+---------+-----------+----------+--------------+ FV Prox  Full           Yes      Yes                                 +---------+---------------+---------+-----------+----------+--------------+ FV Mid   Full           Yes      Yes                                 +---------+---------------+---------+-----------+----------+--------------+  FV DistalFull           No       Yes                                 +---------+---------------+---------+-----------+----------+--------------+ PFV      Full                                                        +---------+---------------+---------+-----------+----------+--------------+ POP      Full           Yes      Yes                                 +---------+---------------+---------+-----------+----------+--------------+ PTV      Full                                                        +---------+---------------+---------+-----------+----------+--------------+ PERO     Full                                                        +---------+---------------+---------+-----------+----------+--------------+     Summary: BILATERAL: - No evidence of deep vein thrombosis seen in the lower extremities, bilaterally. -No evidence of popliteal cyst, bilaterally.   *See table(s) above for measurements and observations. Electronically signed by Waverly Ferrari MD on 02/20/2023 at  12:06:39 PM.    Final    EEG adult  Result Date: 02/20/2023 [name], MD     02/20/2023  9:06 AM Patient Name: Cameron Barton MRN: [id number] Epilepsy Attending: [name] Referring Physician/Provider: Gordy Councilman, MD Date: 02/20/2023 Duration: 22.42 mins Patient history: 89 y.o. male presenting with left-sided weakness getting eeg to evaluate for seizure. Level of alertness: Awake, asleep AEDs during EEG study: None Technical aspects: This EEG study was done with scalp electrodes positioned according to the 10-20 International system of electrode placement. Electrical activity was reviewed with band pass filter of 1-70Hz , sensitivity of 7 uV/mm, display speed of 67mm/sec with a 60Hz  notched filter applied as appropriate. EEG data were recorded continuously and digitally stored.  Video monitoring was available and reviewed as appropriate. Description: The posterior dominant rhythm consists of 8-9 Hz activity of moderate voltage (25-35 uV) seen predominantly in posterior head regions, symmetric and reactive to eye opening and eye closing. Sleep was characterized by vertex waves, sleep spindles (12 to 14 Hz), maximal frontocentral region. EEG showed continuous 3 to 6 Hz theta-delta slowing in right temporal region. Physiologic photic driving was not seen during photic stimulation.  Hyperventilation was not performed.   ABNORMALITY - Continuous slow, right temporal region IMPRESSION: This study is suggestive of cortical dysfunction arising from right temporal region likely secondary to underlying structural abnormality. No seizures or epileptiform discharges were seen throughout the recording. Priyanka Annabelle Harman   MR ANGIO HEAD WO CONTRAST  Result Date: 02/20/2023 CLINICAL DATA:  Initial evaluation for neuro deficit, stroke. EXAM: MRI HEAD WITHOUT CONTRAST MRA HEAD WITHOUT CONTRAST TECHNIQUE: Multiplanar, multi-echo pulse sequences of the brain and surrounding structures were acquired without  intravenous contrast. Angiographic images of the Circle of Willis were acquired using MRA technique without intravenous contrast. COMPARISON:  CTs from 02/19/2023. FINDINGS: MRI HEAD FINDINGS Brain: Examination technically limited as the patient was unable to tolerate the full length of the study. Additionally, provided images are degraded by motion artifact. Diffuse prominence of the CSF containing spaces compatible generalized cerebral atrophy. Patchy and confluent T2/FLAIR hyperintensity involving the periventricular and deep white matter both cerebral hemispheres, consistent with chronic small vessel ischemic disease, moderately advanced in nature. Few scattered small remote bilateral cerebellar infarcts noted. Patchy restricted diffusion involving the right caudate and lentiform nuclei, consistent with acute ischemic infarcts. Additional patchy involvement of the right temporal periventricular white matter about the anterior right temporal horn (series 5, images 65, 67). Additional tiny punctate focus of acute ischemia noted involving the subcortical posterior right frontal region (series 5, image 85). No significant regional mass effect. Evaluation for associated blood products limited by lack of a susceptibility weighted sequence. No other evidence for acute or subacute ischemia. Gray-white matter differentiation otherwise maintained. Probable small meningioma overlying the parasagittal anterior right frontal convexity measures 1.3 cm (series 11, image 20). No associated edema or mass effect. No other mass lesion. No midline shift. Mild ventricular prominence related to global parenchymal volume loss without hydrocephalus. No extra-axial fluid collection. Pituitary gland and suprasellar region grossly within normal limits. Vascular: Major intracranial vascular flow voids are maintained. Skull and upper cervical spine: Craniocervical junction within normal limits. Bone marrow signal intensity grossly normal.  No scalp soft tissue abnormality. Sinuses/Orbits: Prior bilateral ocular lens replacement. Paranasal sinuses are largely clear. No significant mastoid effusion. Other: None. MRA HEAD FINDINGS Anterior circulation: Examination degraded by motion artifact. Both internal carotid arteries are patent through the siphons without visible stenosis or other abnormality. A1 segments patent bilaterally. Normal anterior communicating complex. Anterior cerebral arteries patent without visible stenosis. Left M1 segment widely patent. Atheromatous irregularity with up to moderate stenosis about the mid-distal right M1 segment (series 1032, image 6). No proximal MCA branch occlusion or high-grade stenosis. Distal MCA branches perfused and symmetric. Posterior circulation: Both V4 segments are patent without visible stenosis. Left vertebral artery dominant. Right PICA grossly patent at its origin. Left PICA not seen. Basilar patent without visible stenosis. Superior cerebellar and posterior cerebral arteries patent bilaterally. Anatomic variants: None significant.  No aneurysm. IMPRESSION: MRI HEAD IMPRESSION: 1. Limited and motion degraded exam. 2. Patchy acute ischemic infarcts involving the right basal ganglia and right temporal lobe as above. No significant regional mass effect. 3. Underlying age-related cerebral atrophy with moderate chronic small vessel ischemic disease, with a few scattered remote bilateral cerebellar infarcts. 4. 1.3 cm probable meningioma overlying the parasagittal anterior right frontal convexity. No associated edema or mass effect. MRA HEAD IMPRESSION: 1. Motion degraded exam. 2. Negative intracranial MRA for large vessel occlusion. 3. Atheromatous irregularity with up to moderate stenosis about the mid-distal right M1 segment. 4. No other hemodynamically significant or correctable stenosis. Electronically Signed   By: Rise Mu M.D.   On: 02/20/2023 01:33   MR BRAIN WO CONTRAST  Result  Date: 02/20/2023 CLINICAL DATA:  Initial evaluation for neuro deficit, stroke. EXAM: MRI HEAD WITHOUT CONTRAST MRA HEAD WITHOUT CONTRAST TECHNIQUE: Multiplanar, multi-echo pulse sequences of the brain and surrounding structures  were acquired without intravenous contrast. Angiographic images of the Circle of Willis were acquired using MRA technique without intravenous contrast. COMPARISON:  CTs from 02/19/2023. FINDINGS: MRI HEAD FINDINGS Brain: Examination technically limited as the patient was unable to tolerate the full length of the study. Additionally, provided images are degraded by motion artifact. Diffuse prominence of the CSF containing spaces compatible generalized cerebral atrophy. Patchy and confluent T2/FLAIR hyperintensity involving the periventricular and deep white matter both cerebral hemispheres, consistent with chronic small vessel ischemic disease, moderately advanced in nature. Few scattered small remote bilateral cerebellar infarcts noted. Patchy restricted diffusion involving the right caudate and lentiform nuclei, consistent with acute ischemic infarcts. Additional patchy involvement of the right temporal periventricular white matter about the anterior right temporal horn (series 5, images 65, 67). Additional tiny punctate focus of acute ischemia noted involving the subcortical posterior right frontal region (series 5, image 85). No significant regional mass effect. Evaluation for associated blood products limited by lack of a susceptibility weighted sequence. No other evidence for acute or subacute ischemia. Gray-white matter differentiation otherwise maintained. Probable small meningioma overlying the parasagittal anterior right frontal convexity measures 1.3 cm (series 11, image 20). No associated edema or mass effect. No other mass lesion. No midline shift. Mild ventricular prominence related to global parenchymal volume loss without hydrocephalus. No extra-axial fluid collection.  Pituitary gland and suprasellar region grossly within normal limits. Vascular: Major intracranial vascular flow voids are maintained. Skull and upper cervical spine: Craniocervical junction within normal limits. Bone marrow signal intensity grossly normal. No scalp soft tissue abnormality. Sinuses/Orbits: Prior bilateral ocular lens replacement. Paranasal sinuses are largely clear. No significant mastoid effusion. Other: None. MRA HEAD FINDINGS Anterior circulation: Examination degraded by motion artifact. Both internal carotid arteries are patent through the siphons without visible stenosis or other abnormality. A1 segments patent bilaterally. Normal anterior communicating complex. Anterior cerebral arteries patent without visible stenosis. Left M1 segment widely patent. Atheromatous irregularity with up to moderate stenosis about the mid-distal right M1 segment (series 1032, image 6). No proximal MCA branch occlusion or high-grade stenosis. Distal MCA branches perfused and symmetric. Posterior circulation: Both V4 segments are patent without visible stenosis. Left vertebral artery dominant. Right PICA grossly patent at its origin. Left PICA not seen. Basilar patent without visible stenosis. Superior cerebellar and posterior cerebral arteries patent bilaterally. Anatomic variants: None significant.  No aneurysm. IMPRESSION: MRI HEAD IMPRESSION: 1. Limited and motion degraded exam. 2. Patchy acute ischemic infarcts involving the right basal ganglia and right temporal lobe as above. No significant regional mass effect. 3. Underlying age-related cerebral atrophy with moderate chronic small vessel ischemic disease, with a few scattered remote bilateral cerebellar infarcts. 4. 1.3 cm probable meningioma overlying the parasagittal anterior right frontal convexity. No associated edema or mass effect. MRA HEAD IMPRESSION: 1. Motion degraded exam. 2. Negative intracranial MRA for large vessel occlusion. 3. Atheromatous  irregularity with up to moderate stenosis about the mid-distal right M1 segment. 4. No other hemodynamically significant or correctable stenosis. Electronically Signed   By: Rise Mu M.D.   On: 02/20/2023 01:33   CT ANGIO HEAD NECK W WO CM (CODE STROKE)  Result Date: 02/19/2023 CLINICAL DATA:  Neuro deficit, acute, stroke suspected. EXAM: CT ANGIOGRAPHY HEAD AND NECK WITH AND WITHOUT CONTRAST TECHNIQUE: Multidetector CT imaging of the head and neck was performed using the standard protocol during bolus administration of intravenous contrast. Multiplanar CT image reconstructions and MIPs were obtained to evaluate the vascular anatomy. Carotid stenosis measurements (when applicable)  are obtained utilizing NASCET criteria, using the distal internal carotid diameter as the denominator. RADIATION DOSE REDUCTION: This exam was performed according to the departmental dose-optimization program which includes automated exposure control, adjustment of the mA and/or kV according to patient size and/or use of iterative reconstruction technique. CONTRAST:  OMNIPAQUE IOHEXOL 350 MG/ML SOLN COMPARISON:  Head CT 02/19/2023. FINDINGS: CTA NECK FINDINGS Suboptimal contrast bolus, even on delayed images, suggestive of poor cardiac output. Aortic arch: Arch vessels are obscured by hang up of contrast bolus in the left brachiocephalic vein. Right carotid system: No evidence of dissection, stenosis (50% or greater), or occlusion. Left carotid system: No evidence of dissection, stenosis (50% or greater), or occlusion. Vertebral arteries: Codominant. No evidence of dissection, stenosis (50% or greater), or occlusion. Skeleton: Multilevel cervical spondylosis without high-grade spinal canal stenosis. Other neck: Unremarkable. Upper chest: Unremarkable Review of the MIP images confirms the above findings CTA HEAD FINDINGS Anterior circulation: Intracranial ICAs are patent without stenosis. The proximal ACAs and MCAs are  patent without stenosis or aneurysm. Distal branches are poorly opacified, even on delayed images. Posterior circulation: Normal basilar artery. The SCAs, AICAs and PICAs are patent proximally. The PCAs are patent proximally without stenosis or aneurysm. Distal branches are poorly opacified, even on delayed images. Venous sinuses: Patent. Anatomic variants: None. Review of the MIP images confirms the above findings IMPRESSION: Suboptimal contrast bolus, even on delayed images, suggestive of poor cardiac output. Within this limitation, no large vessel occlusion or significant stenosis in the head or neck. Electronically Signed   By: Orvan Falconer M.D.   On: 02/19/2023 19:30   CT HEAD CODE STROKE WO CONTRAST  Result Date: 02/19/2023 CLINICAL DATA:  Code stroke. EXAM: CT HEAD WITHOUT CONTRAST TECHNIQUE: Contiguous axial images were obtained from the base of the skull through the vertex without intravenous contrast. RADIATION DOSE REDUCTION: This exam was performed according to the departmental dose-optimization program which includes automated exposure control, adjustment of the mA and/or kV according to patient size and/or use of iterative reconstruction technique. COMPARISON:  None CT head without contrast 10/04/2021 FINDINGS: Brain: No acute infarct, hemorrhage, or mass lesion is present. Moderate generalized atrophy and diffuse white matter disease is similar the prior exam. The ventricles are proportionate to the degree of atrophy. No significant extraaxial fluid collection is present. Deep brain nuclei are within normal limits. The brainstem and cerebellum are within normal limits. Vascular: No hyperdense vessel or unexpected calcification. Skull: Calvarium is intact. No focal lytic or blastic lesions are present. No significant extracranial soft tissue lesion is present. Sinuses/Orbits: The paranasal sinuses and mastoid air cells are clear. Bilateral lens replacements are noted. Globes and orbits are  otherwise unremarkable. ASPECTS [facility] Stroke Program Early CT Score) - Ganglionic level infarction (caudate, lentiform nuclei, internal capsule, insula, M1-M3 cortex): 7/7 - Supraganglionic infarction (M4-M6 cortex): 3/3 Total score (0-10 with 10 being normal): 10/10 IMPRESSION: 1. No acute intracranial abnormality or significant interval change. 2. Stable atrophy and diffuse white matter disease. This likely reflects the sequela of chronic microvascular ischemia. 3. Aspects is 10/10. The above was relayed via text pager to Dr. Rollene Fare On 02/19/2023 at 18:57 . Electronically Signed   By: Marin Roberts M.D.   On: 02/19/2023 18:57    PHYSICAL EXAM  Temp:  [97.6 F (36.4 C)-98.5 F (36.9 C)] 97.8 F (36.6 C) (05/14 1007) Pulse Rate:  [45-75] 69 (05/14 1007) Resp:  [15-25] 24 (05/14 1200) BP: (118-150)/(66-90) 128/84 (05/14 1300) SpO2:  [93 %-  100 %] 100 % (05/14 1007) Weight:  [69.2 kg] 69.2 kg (05/13 2015)  General - Well nourished, well developed, in no apparent distress Cardiovascular - Regular rhythm and rate.  Mental Status -  Patient is alert and awake oriented to self cannot answer any other orientation questions correctly.  Patient is not a good historian.  Speech is dysarthric  Cranial Nerves II - XII - II - Visual field intact OU. III, IV, VI - Extraocular movements intact. V - Facial sensation intact bilaterally. VII -slight left facial droop however patient is in edentulous VIII - Hearing & vestibular intact bilaterally. X - Palate elevates symmetrically. XI - Chin turning & shoulder shrug intact bilaterally. XII - Tongue protrusion intact. Motor Strength -left arm and left leg 4 out of 5 with right arm drift.  Right arm right leg 5 out of 5 Motor Tone - Muscle tone was assessed at the neck and appendages and was normal.  Sensory - Light touch, temperature/pinprick were assessed and were symmetrical.    Coordination -appears to have some ataxia on  finger-nose  Gait and Station - deferred.  ASSESSMENT/PLAN Mr. MAIKO IFILL is a 89 y.o. male with history of dementia and pre-existing left-sided weakness for about the past year who presents with fluctuating left-sided weakness today   Stroke: Acute right MCA scattered infarcts, likely due to right MCA stenosis vs. Cardioembolic source.  Code Stroke CT head No acute abnormality. Small vessel disease. Atrophy. ASPECTS 10.    CTA head & neck poor timing of bolus and no LVO MRI patchy acute ischemic infarcts in right BG, caudate, CR and right temporal lobe, 1.3 cm probable meningioma over parasagittal anterior right frontal convexity MRA no LVO, up to moderate stenosis of mid distal right M1 [place] BLE no DVT 2D Echo pending Consider 30-day monitoring after discharge LDL 78 HgbA1c 4.9 VTE prophylaxis -Lovenox No antithrombotics prior to admission, now on aspirin 81 mg daily and Plavix 75 mg daily DAPT for 3 weeks then aspirin alone Therapy recommendations: SNF Disposition: Pending  Hypertension Home meds: None Stable Long-term BP goal normotensive  Hyperlipidemia Home meds: None LDL 78, goal < 70 Add Crestor 20 mg Continue statin at discharge  Other Stroke Risk Factors Advanced Age >/= 72   Other acute issues Dementia - total dependent at home, sister and caregiver not able to handle him at home anymore, will need facility placement.  Chronic left sided weakness for 1.5 years now per sister - etiology not quite clear.   Hospital day # 0  Gevena Mart DNP, ACNPC-AG  [facility]  ATTENDING NOTE: I reviewed above note and agree with the assessment and plan. Pt was seen and examined.   Sister and caregiver are outside of the room.  They stated that patient is totally dependent at home needed 24/7 care.  They are currently not able to provide that care anymore and needed facility placement.  They also mentioned that patient had chronic left-sided weakness for the  last 1 and half years, etiology unclear.  But this time left-sided weakness getting worse.  MRI showed right MCA scattered stroke, CTA poor imaging but MRI showed right MCA up to moderate stenosis.  Stroke etiology could be right MCA stenosis versus cardioembolic source.  Recommend 30-day cardiac event monitor as outpatient to rule out A-fib.  Continue DAPT for 3 weeks and then aspirin alone.  Put on Crestor 20.  PT and OT recommend SNF.  For detailed assessment and plan, please refer  to above/below as I have made changes wherever appropriate.   Marvel Plan, MD PhD Stroke Neurology 02/20/2023 4:39 PM  Neurology will sign off. Please call with questions. Pt will follow up with stroke clinic NP at [facility] in about 4 weeks. Thanks for the consult.   To contact Stroke Continuity provider, please refer to [email or web address]. After hours, contact General Neurology

## 2023-02-20 NOTE — ED Notes (Signed)
ED TO INPATIENT HANDOFF REPORT  ED Nurse Name and Phone #: [phone]  S Name/Age/Gender Cameron Barton 89 y.o. male Room/Bed: 037C/037C  Code Status   Code Status: Full Code  Home/SNF/Other Home Patient oriented to: self and place Is this baseline? Yes   Triage Complete: Triage complete  Chief Complaint Left-sided weakness [R53.1]  Triage Note Pt BIB [facility] from home as Code Stroke, LKN 12 seen by wife at baseline. Brother found him sitting in his chair leaning to the Lt, with Lt weakness, Lt droop & slurred speech. VSS @ 126/88, 78 bpm, 100 % on RA, CBG 115, 12L good, not on thinners. Baseline is A/Ox4 & uses rolling walker.   Allergies No Known Allergies  Level of Care/Admitting Diagnosis ED Disposition     ED Disposition  Admit   Condition  --   Comment  Hospital Area: [facility] [[id number]]  Level of Care: Telemetry Medical [104]  May place patient in observation at [facility] or [facility] if equivalent level of care is available:: Yes  Covid Evaluation: Asymptomatic - no recent exposure (last 10 days) testing not required  Diagnosis: Left-sided weakness [284132]  Admitting Physician: Lance Muss [[id number]]  Attending Physician: Nestor Ramp [[id number]]          B Medical/Surgery History Past Medical History:  Diagnosis Date   Arthritis    Past Surgical History:  Procedure Laterality Date   HERNIA REPAIR     INGUINAL HERNIA REPAIR Left 10/02/2021   Procedure: HERNIA REPAIR INGUINAL INCARCERATED;  Surgeon: Romie Levee, MD;  Location: [facility];  Service: General;  Laterality: Left;     A IV Location/Drains/Wounds Patient Lines/Drains/Airways Status     Active Line/Drains/Airways     Name Placement date Placement time Site Days   Peripheral IV 02/19/23 20 G Left Antecubital 02/19/23  1850  Antecubital  1   Incision (Closed) 10/02/21 Groin Left 10/02/21  2101  -- 506            Intake/Output Last 24 hours  Intake/Output  Summary (Last 24 hours) at 02/20/2023 1253 Last data filed at 02/19/2023 2213 Gross per 24 hour  Intake 1000 ml  Output --  Net 1000 ml    Labs/Imaging Results for orders placed or performed during the hospital encounter of 02/19/23 (from the past 48 hour(s))  Protime-INR     Status: None   Collection Time: 02/19/23  6:40 PM  Result Value Ref Range   Prothrombin Time 14.6 11.4 - 15.2 seconds   INR 1.1 0.8 - 1.2    Comment: (NOTE) INR goal varies based on device and disease states. Performed at [facility], [address], [place], [place] [address]   APTT     Status: None   Collection Time: 02/19/23  6:40 PM  Result Value Ref Range   aPTT 31 24 - 36 seconds    Comment: Performed at [facility], [address], [place], [place] [address]  CBC     Status: None   Collection Time: 02/19/23  6:40 PM  Result Value Ref Range   WBC 5.4 4.0 - 10.5 K/uL   RBC 5.72 4.22 - 5.81 MIL/uL   Hemoglobin 15.8 13.0 - 17.0 g/dL   HCT 74.2 59.5 - 63.8 %   MCV 84.4 80.0 - 100.0 fL   MCH 27.6 26.0 - 34.0 pg   MCHC 32.7 30.0 - 36.0 g/dL   RDW 75.6 43.3 - 29.5 %  Platelets 395 150 - 400 K/uL   nRBC 0.0 0.0 - 0.2 %    Comment: Performed at [facility], [address], [place], [place] [address]  Differential     Status: None   Collection Time: 02/19/23  6:40 PM  Result Value Ref Range   Neutrophils Relative % 61 %   Neutro Abs 3.3 1.7 - 7.7 K/uL   Lymphocytes Relative 23 %   Lymphs Abs 1.2 0.7 - 4.0 K/uL   Monocytes Relative 14 %   Monocytes Absolute 0.8 0.1 - 1.0 K/uL   Eosinophils Relative 0 %   Eosinophils Absolute 0.0 0.0 - 0.5 K/uL   Basophils Relative 1 %   Basophils Absolute 0.1 0.0 - 0.1 K/uL   Immature Granulocytes 1 %   Abs Immature Granulocytes 0.04 0.00 - 0.07 K/uL    Comment: Performed at [facility], [address], [place], [place] [address]  CBG monitoring, ED     Status: None   Collection Time: 02/19/23  6:47 PM  Result Value Ref Range    Glucose-Capillary 90 70 - 99 mg/dL    Comment: Glucose reference range applies only to samples taken after fasting for at least 8 hours.  I-stat chem 8, ED     Status: Abnormal   Collection Time: 02/19/23  6:52 PM  Result Value Ref Range   Sodium 137 135 - 145 mmol/L   Potassium 6.7 (HH) 3.5 - 5.1 mmol/L   Chloride 104 98 - 111 mmol/L   BUN 16 8 - 23 mg/dL   Creatinine, Ser 8.29 0.61 - 1.24 mg/dL   Glucose, Bld 91 70 - 99 mg/dL    Comment: Glucose reference range applies only to samples taken after fasting for at least 8 hours.   Calcium, Ion 1.00 (L) 1.15 - 1.40 mmol/L   TCO2 27 22 - 32 mmol/L   Hemoglobin 16.3 13.0 - 17.0 g/dL   HCT 56.2 13.0 - 86.5 %   Comment NOTIFIED PHYSICIAN   Comprehensive metabolic panel     Status: Abnormal   Collection Time: 02/19/23  8:27 PM  Result Value Ref Range   Sodium 137 135 - 145 mmol/L   Potassium 4.4 3.5 - 5.1 mmol/L   Chloride 102 98 - 111 mmol/L   CO2 24 22 - 32 mmol/L   Glucose, Bld 91 70 - 99 mg/dL    Comment: Glucose reference range applies only to samples taken after fasting for at least 8 hours.   BUN 11 8 - 23 mg/dL   Creatinine, Ser 7.84 0.61 - 1.24 mg/dL   Calcium 9.2 8.9 - 69.6 mg/dL   Total Protein 6.4 (L) 6.5 - 8.1 g/dL   Albumin 3.7 3.5 - 5.0 g/dL   AST 16 15 - 41 U/L   ALT 10 0 - 44 U/L   Alkaline Phosphatase 44 38 - 126 U/L   Total Bilirubin 0.5 0.3 - 1.2 mg/dL   GFR, Estimated >29 >52 mL/min    Comment: (NOTE) Calculated using the CKD-EPI Creatinine Equation (2021)    Anion gap 11 5 - 15    Comment: Performed at [facility], [address], [place], [place] [address]  Hemoglobin A1c     Status: None   Collection Time: 02/19/23  8:27 PM  Result Value Ref Range   Hgb A1c MFr Bld 4.9 4.8 - 5.6 %    Comment: (NOTE) Pre diabetes:  5.7%-6.4%  Diabetes:              >6.4%  Glycemic control for   <7.0% adults with diabetes    Mean Plasma Glucose 93.93 mg/dL    Comment: Performed at [facility]  [facility], [address] [place], [place] [address]  Hepatic function panel     Status: None   Collection Time: 02/19/23  8:27 PM  Result Value Ref Range   Total Protein 6.6 6.5 - 8.1 g/dL   Albumin 3.7 3.5 - 5.0 g/dL   AST 16 15 - 41 U/L   ALT 10 0 - 44 U/L   Alkaline Phosphatase 45 38 - 126 U/L   Total Bilirubin 0.5 0.3 - 1.2 mg/dL   Bilirubin, Direct 0.2 0.0 - 0.2 mg/dL   Indirect Bilirubin 0.3 0.3 - 0.9 mg/dL    Comment: Performed at [facility], [address], [place], [place] [address]  Magnesium     Status: None   Collection Time: 02/19/23  8:27 PM  Result Value Ref Range   Magnesium 1.8 1.7 - 2.4 mg/dL    Comment: Performed at [facility], [address] [place], [place] [address]  Lipid panel     Status: None   Collection Time: 02/20/23  1:53 AM  Result Value Ref Range   Cholesterol 151 0 - 200 mg/dL   Triglycerides 35 <347 mg/dL   HDL 66 >42 mg/dL   Total CHOL/HDL Ratio 2.3 RATIO   VLDL 7 0 - 40 mg/dL   LDL Cholesterol 78 0 - 99 mg/dL    Comment:        Total Cholesterol/HDL:CHD Risk Coronary Heart Disease Risk Table                     Men   Women  1/2 Average Risk   3.4   3.3  Average Risk       5.0   4.4  2 X Average Risk   9.6   7.1  3 X Average Risk  23.4   11.0        Use the calculated Patient Ratio above and the CHD Risk Table to determine the patient's CHD Risk.        ATP III CLASSIFICATION (LDL):  <100     mg/dL   Optimal  595-638  mg/dL   Near or Above                    Optimal  130-159  mg/dL   Borderline  756-433  mg/dL   High  >295     mg/dL   Very High Performed at [facility], [address], [place], [place] [address]   Ethanol     Status: None   Collection Time: 02/20/23  1:53 AM  Result Value Ref Range   Alcohol, Ethyl (B) <10 <10 mg/dL    Comment: (NOTE) Lowest detectable limit for serum alcohol is 10 mg/dL.  For medical purposes only. Performed at [facility], [address], [place], [place] [address]     VAS [place] LOWER EXTREMITY VENOUS (DVT)  Result Date: 02/20/2023  Lower Venous DVT Study Patient Name:  Cameron Barton  Date of Exam:   02/20/2023 Medical Rec #: [id number]       Accession #:    [id number] Date of Birth: 01-04-1929       Patient Gender: M Patient Age:  89 years Exam Location:  [facility] Procedure:      VAS [place] LOWER EXTREMITY VENOUS (DVT) Referring Phys: Scheryl Marten XU --------------------------------------------------------------------------------  Indications: Stroke.  Comparison Study: No previous exams Performing Technologist: Jody Hill RVT, RDMS  Examination Guidelines: A complete evaluation includes B-mode imaging, spectral Doppler, color Doppler, and power Doppler as needed of all accessible portions of each vessel. Bilateral testing is considered an integral part of a complete examination. Limited examinations for reoccurring indications may be performed as noted. The reflux portion of the exam is performed with the patient in reverse Trendelenburg.  +---------+---------------+---------+-----------+----------+--------------+ RIGHT    CompressibilityPhasicitySpontaneityPropertiesThrombus Aging +---------+---------------+---------+-----------+----------+--------------+ CFV      Full           Yes      Yes                                 +---------+---------------+---------+-----------+----------+--------------+ SFJ      Full                                                        +---------+---------------+---------+-----------+----------+--------------+ FV Prox  Full           Yes      Yes                                 +---------+---------------+---------+-----------+----------+--------------+ FV Mid   Full           Yes      Yes                                 +---------+---------------+---------+-----------+----------+--------------+ FV DistalFull           Yes      Yes                                  +---------+---------------+---------+-----------+----------+--------------+ PFV      Full                                                        +---------+---------------+---------+-----------+----------+--------------+ POP      Full           Yes      Yes                                 +---------+---------------+---------+-----------+----------+--------------+ PTV      Full                                                        +---------+---------------+---------+-----------+----------+--------------+ PERO     Full                                                        +---------+---------------+---------+-----------+----------+--------------+   +---------+---------------+---------+-----------+----------+--------------+  LEFT     CompressibilityPhasicitySpontaneityPropertiesThrombus Aging +---------+---------------+---------+-----------+----------+--------------+ CFV      Full           Yes      Yes                                 +---------+---------------+---------+-----------+----------+--------------+ SFJ      Full                                                        +---------+---------------+---------+-----------+----------+--------------+ FV Prox  Full           Yes      Yes                                 +---------+---------------+---------+-----------+----------+--------------+ FV Mid   Full           Yes      Yes                                 +---------+---------------+---------+-----------+----------+--------------+ FV DistalFull           No       Yes                                 +---------+---------------+---------+-----------+----------+--------------+ PFV      Full                                                        +---------+---------------+---------+-----------+----------+--------------+ POP      Full           Yes      Yes                                  +---------+---------------+---------+-----------+----------+--------------+ PTV      Full                                                        +---------+---------------+---------+-----------+----------+--------------+ PERO     Full                                                        +---------+---------------+---------+-----------+----------+--------------+     Summary: BILATERAL: - No evidence of deep vein thrombosis seen in the lower extremities, bilaterally. -No evidence of popliteal cyst, bilaterally.   *See table(s) above for measurements and observations. Electronically signed by Waverly Ferrari MD on 02/20/2023 at 12:06:39 PM.    Final    EEG adult  Result Date: 02/20/2023 [name], MD  02/20/2023  9:06 AM Patient Name: Cameron Barton MRN: [id number] Epilepsy Attending: [name] Referring Physician/Provider: Gordy Councilman, MD Date: 02/20/2023 Duration: 22.42 mins Patient history: 89 y.o. male presenting with left-sided weakness getting eeg to evaluate for seizure. Level of alertness: Awake, asleep AEDs during EEG study: None Technical aspects: This EEG study was done with scalp electrodes positioned according to the 10-20 International system of electrode placement. Electrical activity was reviewed with band pass filter of 1-70Hz , sensitivity of 7 uV/mm, display speed of 63mm/sec with a 60Hz  notched filter applied as appropriate. EEG data were recorded continuously and digitally stored.  Video monitoring was available and reviewed as appropriate. Description: The posterior dominant rhythm consists of 8-9 Hz activity of moderate voltage (25-35 uV) seen predominantly in posterior head regions, symmetric and reactive to eye opening and eye closing. Sleep was characterized by vertex waves, sleep spindles (12 to 14 Hz), maximal frontocentral region. EEG showed continuous 3 to 6 Hz theta-delta slowing in right temporal region. Physiologic photic driving was not seen  during photic stimulation.  Hyperventilation was not performed.   ABNORMALITY - Continuous slow, right temporal region IMPRESSION: This study is suggestive of cortical dysfunction arising from right temporal region likely secondary to underlying structural abnormality. No seizures or epileptiform discharges were seen throughout the recording. Cameron Barton   MR ANGIO HEAD WO CONTRAST  Result Date: 02/20/2023 CLINICAL DATA:  Initial evaluation for neuro deficit, stroke. EXAM: MRI HEAD WITHOUT CONTRAST MRA HEAD WITHOUT CONTRAST TECHNIQUE: Multiplanar, multi-echo pulse sequences of the brain and surrounding structures were acquired without intravenous contrast. Angiographic images of the Circle of Willis were acquired using MRA technique without intravenous contrast. COMPARISON:  CTs from 02/19/2023. FINDINGS: MRI HEAD FINDINGS Brain: Examination technically limited as the patient was unable to tolerate the full length of the study. Additionally, provided images are degraded by motion artifact. Diffuse prominence of the CSF containing spaces compatible generalized cerebral atrophy. Patchy and confluent T2/FLAIR hyperintensity involving the periventricular and deep white matter both cerebral hemispheres, consistent with chronic small vessel ischemic disease, moderately advanced in nature. Few scattered small remote bilateral cerebellar infarcts noted. Patchy restricted diffusion involving the right caudate and lentiform nuclei, consistent with acute ischemic infarcts. Additional patchy involvement of the right temporal periventricular white matter about the anterior right temporal horn (series 5, images 65, 67). Additional tiny punctate focus of acute ischemia noted involving the subcortical posterior right frontal region (series 5, image 85). No significant regional mass effect. Evaluation for associated blood products limited by lack of a susceptibility weighted sequence. No other evidence for acute or subacute  ischemia. Gray-white matter differentiation otherwise maintained. Probable small meningioma overlying the parasagittal anterior right frontal convexity measures 1.3 cm (series 11, image 20). No associated edema or mass effect. No other mass lesion. No midline shift. Mild ventricular prominence related to global parenchymal volume loss without hydrocephalus. No extra-axial fluid collection. Pituitary gland and suprasellar region grossly within normal limits. Vascular: Major intracranial vascular flow voids are maintained. Skull and upper cervical spine: Craniocervical junction within normal limits. Bone marrow signal intensity grossly normal. No scalp soft tissue abnormality. Sinuses/Orbits: Prior bilateral ocular lens replacement. Paranasal sinuses are largely clear. No significant mastoid effusion. Other: None. MRA HEAD FINDINGS Anterior circulation: Examination degraded by motion artifact. Both internal carotid arteries are patent through the siphons without visible stenosis or other abnormality. A1 segments patent bilaterally. Normal anterior communicating complex. Anterior cerebral arteries patent without visible stenosis. Left M1 segment widely patent.  Atheromatous irregularity with up to moderate stenosis about the mid-distal right M1 segment (series 1032, image 6). No proximal MCA branch occlusion or high-grade stenosis. Distal MCA branches perfused and symmetric. Posterior circulation: Both V4 segments are patent without visible stenosis. Left vertebral artery dominant. Right PICA grossly patent at its origin. Left PICA not seen. Basilar patent without visible stenosis. Superior cerebellar and posterior cerebral arteries patent bilaterally. Anatomic variants: None significant.  No aneurysm. IMPRESSION: MRI HEAD IMPRESSION: 1. Limited and motion degraded exam. 2. Patchy acute ischemic infarcts involving the right basal ganglia and right temporal lobe as above. No significant regional mass effect. 3.  Underlying age-related cerebral atrophy with moderate chronic small vessel ischemic disease, with a few scattered remote bilateral cerebellar infarcts. 4. 1.3 cm probable meningioma overlying the parasagittal anterior right frontal convexity. No associated edema or mass effect. MRA HEAD IMPRESSION: 1. Motion degraded exam. 2. Negative intracranial MRA for large vessel occlusion. 3. Atheromatous irregularity with up to moderate stenosis about the mid-distal right M1 segment. 4. No other hemodynamically significant or correctable stenosis. Electronically Signed   By: Rise Mu M.D.   On: 02/20/2023 01:33   MR BRAIN WO CONTRAST  Result Date: 02/20/2023 CLINICAL DATA:  Initial evaluation for neuro deficit, stroke. EXAM: MRI HEAD WITHOUT CONTRAST MRA HEAD WITHOUT CONTRAST TECHNIQUE: Multiplanar, multi-echo pulse sequences of the brain and surrounding structures were acquired without intravenous contrast. Angiographic images of the Circle of Willis were acquired using MRA technique without intravenous contrast. COMPARISON:  CTs from 02/19/2023. FINDINGS: MRI HEAD FINDINGS Brain: Examination technically limited as the patient was unable to tolerate the full length of the study. Additionally, provided images are degraded by motion artifact. Diffuse prominence of the CSF containing spaces compatible generalized cerebral atrophy. Patchy and confluent T2/FLAIR hyperintensity involving the periventricular and deep white matter both cerebral hemispheres, consistent with chronic small vessel ischemic disease, moderately advanced in nature. Few scattered small remote bilateral cerebellar infarcts noted. Patchy restricted diffusion involving the right caudate and lentiform nuclei, consistent with acute ischemic infarcts. Additional patchy involvement of the right temporal periventricular white matter about the anterior right temporal horn (series 5, images 65, 67). Additional tiny punctate focus of acute ischemia  noted involving the subcortical posterior right frontal region (series 5, image 85). No significant regional mass effect. Evaluation for associated blood products limited by lack of a susceptibility weighted sequence. No other evidence for acute or subacute ischemia. Gray-white matter differentiation otherwise maintained. Probable small meningioma overlying the parasagittal anterior right frontal convexity measures 1.3 cm (series 11, image 20). No associated edema or mass effect. No other mass lesion. No midline shift. Mild ventricular prominence related to global parenchymal volume loss without hydrocephalus. No extra-axial fluid collection. Pituitary gland and suprasellar region grossly within normal limits. Vascular: Major intracranial vascular flow voids are maintained. Skull and upper cervical spine: Craniocervical junction within normal limits. Bone marrow signal intensity grossly normal. No scalp soft tissue abnormality. Sinuses/Orbits: Prior bilateral ocular lens replacement. Paranasal sinuses are largely clear. No significant mastoid effusion. Other: None. MRA HEAD FINDINGS Anterior circulation: Examination degraded by motion artifact. Both internal carotid arteries are patent through the siphons without visible stenosis or other abnormality. A1 segments patent bilaterally. Normal anterior communicating complex. Anterior cerebral arteries patent without visible stenosis. Left M1 segment widely patent. Atheromatous irregularity with up to moderate stenosis about the mid-distal right M1 segment (series 1032, image 6). No proximal MCA branch occlusion or high-grade stenosis. Distal MCA branches perfused and symmetric. Posterior circulation:  Both V4 segments are patent without visible stenosis. Left vertebral artery dominant. Right PICA grossly patent at its origin. Left PICA not seen. Basilar patent without visible stenosis. Superior cerebellar and posterior cerebral arteries patent bilaterally. Anatomic  variants: None significant.  No aneurysm. IMPRESSION: MRI HEAD IMPRESSION: 1. Limited and motion degraded exam. 2. Patchy acute ischemic infarcts involving the right basal ganglia and right temporal lobe as above. No significant regional mass effect. 3. Underlying age-related cerebral atrophy with moderate chronic small vessel ischemic disease, with a few scattered remote bilateral cerebellar infarcts. 4. 1.3 cm probable meningioma overlying the parasagittal anterior right frontal convexity. No associated edema or mass effect. MRA HEAD IMPRESSION: 1. Motion degraded exam. 2. Negative intracranial MRA for large vessel occlusion. 3. Atheromatous irregularity with up to moderate stenosis about the mid-distal right M1 segment. 4. No other hemodynamically significant or correctable stenosis. Electronically Signed   By: Rise Mu M.D.   On: 02/20/2023 01:33   CT ANGIO HEAD NECK W WO CM (CODE STROKE)  Result Date: 02/19/2023 CLINICAL DATA:  Neuro deficit, acute, stroke suspected. EXAM: CT ANGIOGRAPHY HEAD AND NECK WITH AND WITHOUT CONTRAST TECHNIQUE: Multidetector CT imaging of the head and neck was performed using the standard protocol during bolus administration of intravenous contrast. Multiplanar CT image reconstructions and MIPs were obtained to evaluate the vascular anatomy. Carotid stenosis measurements (when applicable) are obtained utilizing NASCET criteria, using the distal internal carotid diameter as the denominator. RADIATION DOSE REDUCTION: This exam was performed according to the departmental dose-optimization program which includes automated exposure control, adjustment of the mA and/or kV according to patient size and/or use of iterative reconstruction technique. CONTRAST:  OMNIPAQUE IOHEXOL 350 MG/ML SOLN COMPARISON:  Head CT 02/19/2023. FINDINGS: CTA NECK FINDINGS Suboptimal contrast bolus, even on delayed images, suggestive of poor cardiac output. Aortic arch: Arch vessels are  obscured by hang up of contrast bolus in the left brachiocephalic vein. Right carotid system: No evidence of dissection, stenosis (50% or greater), or occlusion. Left carotid system: No evidence of dissection, stenosis (50% or greater), or occlusion. Vertebral arteries: Codominant. No evidence of dissection, stenosis (50% or greater), or occlusion. Skeleton: Multilevel cervical spondylosis without high-grade spinal canal stenosis. Other neck: Unremarkable. Upper chest: Unremarkable Review of the MIP images confirms the above findings CTA HEAD FINDINGS Anterior circulation: Intracranial ICAs are patent without stenosis. The proximal ACAs and MCAs are patent without stenosis or aneurysm. Distal branches are poorly opacified, even on delayed images. Posterior circulation: Normal basilar artery. The SCAs, AICAs and PICAs are patent proximally. The PCAs are patent proximally without stenosis or aneurysm. Distal branches are poorly opacified, even on delayed images. Venous sinuses: Patent. Anatomic variants: None. Review of the MIP images confirms the above findings IMPRESSION: Suboptimal contrast bolus, even on delayed images, suggestive of poor cardiac output. Within this limitation, no large vessel occlusion or significant stenosis in the head or neck. Electronically Signed   By: Orvan Falconer M.D.   On: 02/19/2023 19:30   CT HEAD CODE STROKE WO CONTRAST  Result Date: 02/19/2023 CLINICAL DATA:  Code stroke. EXAM: CT HEAD WITHOUT CONTRAST TECHNIQUE: Contiguous axial images were obtained from the base of the skull through the vertex without intravenous contrast. RADIATION DOSE REDUCTION: This exam was performed according to the departmental dose-optimization program which includes automated exposure control, adjustment of the mA and/or kV according to patient size and/or use of iterative reconstruction technique. COMPARISON:  None CT head without contrast 10/04/2021 FINDINGS: Brain: No acute infarct,  hemorrhage, or  mass lesion is present. Moderate generalized atrophy and diffuse white matter disease is similar the prior exam. The ventricles are proportionate to the degree of atrophy. No significant extraaxial fluid collection is present. Deep brain nuclei are within normal limits. The brainstem and cerebellum are within normal limits. Vascular: No hyperdense vessel or unexpected calcification. Skull: Calvarium is intact. No focal lytic or blastic lesions are present. No significant extracranial soft tissue lesion is present. Sinuses/Orbits: The paranasal sinuses and mastoid air cells are clear. Bilateral lens replacements are noted. Globes and orbits are otherwise unremarkable. ASPECTS [facility] Stroke Program Early CT Score) - Ganglionic level infarction (caudate, lentiform nuclei, internal capsule, insula, M1-M3 cortex): 7/7 - Supraganglionic infarction (M4-M6 cortex): 3/3 Total score (0-10 with 10 being normal): 10/10 IMPRESSION: 1. No acute intracranial abnormality or significant interval change. 2. Stable atrophy and diffuse white matter disease. This likely reflects the sequela of chronic microvascular ischemia. 3. Aspects is 10/10. The above was relayed via text pager to Dr. Rollene Fare On 02/19/2023 at 18:57 . Electronically Signed   By: Marin Roberts M.D.   On: 02/19/2023 18:57    Pending Labs Unresulted Labs (From admission, onward)    None       Vitals/Pain Today's Vitals   02/20/23 0643 02/20/23 0700 02/20/23 0725 02/20/23 1007  BP: 129/85   124/66  Pulse: 62 (!) 51  69  Resp: 18 18  (!) 25  Temp:   97.7 F (36.5 C) 97.8 F (36.6 C)  TempSrc:   Oral Oral  SpO2: 97% 95%  100%  Weight:      Height:      PainSc:        Isolation Precautions No active isolations  Medications Medications  aspirin EC tablet 81 mg (81 mg Oral Given 02/20/23 1048)  clopidogrel (PLAVIX) tablet 300 mg (300 mg Oral Given 02/19/23 2019)    Followed by  clopidogrel (PLAVIX) tablet 75 mg (75 mg Oral Given  02/20/23 1048)  enoxaparin (LOVENOX) injection 40 mg (40 mg Subcutaneous Given 02/20/23 1048)  acetaminophen (TYLENOL) tablet 650 mg (has no administration in time range)    Or  acetaminophen (TYLENOL) suppository 650 mg (has no administration in time range)  rosuvastatin (CRESTOR) tablet 20 mg (20 mg Oral Given 02/20/23 1048)  sodium chloride flush (NS) 0.9 % injection 3 mL (3 mLs Intravenous Given 02/19/23 1907)  sodium chloride 0.9 % bolus 1,000 mL (0 mLs Intravenous Stopped 02/19/23 2213)  iohexol (OMNIPAQUE) 350 MG/ML injection 125 mL (125 mLs Intravenous Contrast Given 02/19/23 1914)   stroke: early stages of recovery book ( Does not apply Given 02/20/23 1048)  LORazepam (ATIVAN) tablet 0.5 mg (0.5 mg Oral Given 02/19/23 2359)    Mobility walks with person assist     Focused Assessments Neuro Assessment Handoff:  Swallow screen pass?  Cardiac Rhythm: Sinus bradycardia NIH Stroke Scale  Dizziness Present: No Headache Present: No Interval: Shift assessment Level of Consciousness (1a.)   : Alert, keenly responsive LOC Questions (1b. )   : Answers one question correctly LOC Commands (1c. )   : Performs both tasks correctly Best Gaze (2. )  : Normal Visual (3. )  : No visual loss Facial Palsy (4. )    : Minor paralysis Motor Arm, Left (5a. )   : Some effort against gravity Motor Arm, Right (5b. ) : No drift Motor Leg, Left (6a. )  : Drift Motor Leg, Right (6b. ) : No drift Limb Ataxia (  7. ): Present in one limb Sensory (8. )  : Normal, no sensory loss Best Language (9. )  : Mild-to-moderate aphasia Dysarthria (10. ): Normal Extinction/Inattention (11.)   : No Abnormality Complete NIHSS TOTAL: 7 Last date known well: 02/19/23 Last time known well: 1400 Neuro Assessment: Within Defined Limits Neuro Checks:   Initial (02/19/23 1900)  Has TPA been given? No If patient is a Neuro Trauma and patient is going to OR before floor call report to 4N Charge nurse: [phone] or  [phone]   R Recommendations: See Admitting Provider Note  Report given to:   Additional Notes:  urinal and bedside commode is best for him

## 2023-02-20 NOTE — TOC Transition Note (Signed)
Transition of Care [facility]) - CM/SW Discharge Note   Patient Details  Name: Cameron Barton MRN: [id number] Date of Birth: Oct 29, 1928  [facility] CM/SW Contact:  Susa Simmonds, LCSWA Phone Number: 02/20/2023, 1:51 PM   Clinical Narrative:  CSW contacted patients friend Corena Pilgrim, [phone] and spoke with patients sister Gillian Shields who was also present. Ms. Raul Del told CSW that patient needs long term care and they can no longer care for him at home. Mattie stated she is 89 years old and can't do things like she used to. Ms. Jacqulyn Cane told CSW that she is patients POA and can provide the legal documentation to be uploaded to patients chart. Ms. Jacqulyn Cane asked for CSW and other hospital staff not to give patients daughter Donalynn Furlong any information. Ms. Jacqulyn Cane stated Bonita Quin can visit patient but that's it. Ms. Jacqulyn Cane stated that she doesn't get along with Bonita Quin and she took her brother away from her. Ms. Jacqulyn Cane stated that Bonita Quin took patient to [facility] and didn't care for him. Ms. Jacqulyn Cane stated patient was found dirty and his brief had not been changed. Ms. Jacqulyn Cane stated Bonita Quin treated him like he was nothing. Ms. Jacqulyn Cane stated she would like patient to go to a nursing facility. CSW asked Ms. Mattie if patient has medicaid. Ms. Jacqulyn Cane stated she knows he has medicare but is not sure about medicaid. Ms. Jacqulyn Cane stated patient receives around $2300 a month in SSI. TOC will continue to follow for discharge needs.           Patient Goals and CMS Choice      Discharge Placement                         Discharge Plan and Services Additional resources added to the After Visit Summary for                                       Social Determinants of Health (SDOH) Interventions SDOH Screenings   Tobacco Use: Medium Risk (02/19/2023)     Readmission Risk Interventions     No data to display

## 2023-02-20 NOTE — Evaluation (Addendum)
Physical Therapy Evaluation Patient Details Name: Cameron Barton MRN: [id number] DOB: 09-12-1929 Today's Date: 02/20/2023  History of Present Illness  59 you male admitted with L side weakness MRI (+) acute ischemic infarcts R basal ganglia and R temporal lobe, probable meningioma overlying the parasagittal anterior R frontal convexity PMH HA falls  Clinical Impression  Pt presents to PT with deficits in functional mobility, strength, power, balance, sensation, endurance, cognition. Pt presents with L sided weakness and decreased sensation, LUE weaker than LLE. Pt requires assistance for all aspects of mobility at this time, and demonstrates poor awareness of balance impairments when attempting to ambulate for a short distance with RW. Pt is at a very high risk for falls due to weakness and impaired cognition. PT recommends short term inpatient PT services at the time of discharge to improve ambulation abilities and to reduce falls risk.     Recommendations for follow up therapy are one component of a multi-disciplinary discharge planning process, led by the attending physician.  Recommendations may be updated based on patient status, additional functional criteria and insurance authorization.  Follow Up Recommendations       Assistance Recommended at Discharge Frequent or constant Supervision/Assistance  Patient can return home with the following  A lot of help with walking and/or transfers;A lot of help with bathing/dressing/bathroom;Assistance with cooking/housework;Assistance with feeding;Direct supervision/assist for medications management;Direct supervision/assist for financial management;Assist for transportation;Help with stairs or ramp for entrance    Equipment Recommendations Wheelchair (measurements PT);Hospital bed;BSC/3in1  Recommendations for Other Services  Rehab consult    Functional Status Assessment Patient has had a recent decline in their functional status and  demonstrates the ability to make significant improvements in function in a reasonable and predictable amount of time.     Precautions / Restrictions Precautions Precautions: Fall Restrictions Weight Bearing Restrictions: No      Mobility  Bed Mobility Overal bed mobility: Needs Assistance Bed Mobility: Supine to Sit, Sit to Supine     Supine to sit: Min assist, HOB elevated Sit to supine: Min assist        Transfers Overall transfer level: Needs assistance Equipment used: Rolling walker (2 wheels), 1 person hand held assist Transfers: Sit to/from Stand Sit to Stand: Min assist           General transfer comment: pt benefits from cues for hand placement along with physical assistance to ascend into standing    Ambulation/Gait Ambulation/Gait assistance: Mod assist Gait Distance (Feet): 4 Feet Assistive device: Rolling walker (2 wheels) Gait Pattern/deviations: Step-to pattern, Wide base of support Gait velocity: reduced Gait velocity interpretation: <1.31 ft/sec, indicative of household ambulator   General Gait Details: pt with slowed step-to gait, increased trunk flexion, very poor management of walker and awareness of balance deficits  Stairs            Wheelchair Mobility    Modified Rankin (Stroke Patients Only) Modified Rankin (Stroke Patients Only) Pre-Morbid Rankin Score: Moderate disability Modified Rankin: Moderately severe disability     Balance Overall balance assessment: Needs assistance Sitting-balance support: No upper extremity supported, Feet supported Sitting balance-Leahy Scale: Fair     Standing balance support: Bilateral upper extremity supported, Reliant on assistive device for balance Standing balance-Leahy Scale: Poor Standing balance comment: minA for static standing                             Pertinent Vitals/Pain Pain Assessment Pain Assessment: No/denies pain  Home Living Family/patient expects to be  discharged to:: Private residence Living Arrangements: Non-relatives/Friends (Ms. Ann) Available Help at Discharge: Family;Available 24 hours/day Type of Home: House Home Access: Level entry       Home Layout: One level Home Equipment: Cane - quad (small based quad cane) Additional Comments: will need to confirm history with family/caregivers due to cognitive deficits    Prior Function Prior Level of Function : Needs assist             Mobility Comments: ambulatory with small based quad cane per patient ADLs Comments: assistance for all ADLs     Hand Dominance        Extremity/Trunk Assessment   Upper Extremity Assessment Upper Extremity Assessment: LUE deficits/detail LUE Deficits / Details: 4-/5 grip, 3-/5 shoulder flexion and elbow flexion/extension LUE Sensation: decreased light touch    Lower Extremity Assessment Lower Extremity Assessment: LLE deficits/detail LLE Deficits / Details: grossly 4-/5 LLE Sensation: decreased light touch    Cervical / Trunk Assessment Cervical / Trunk Assessment: Normal  Communication   Communication: Other (comment) (dysarthria)  Cognition Arousal/Alertness: Awake/alert Behavior During Therapy: Impulsive Overall Cognitive Status: No family/caregiver present to determine baseline cognitive functioning Area of Impairment: Orientation, Attention, Memory, Following commands, Safety/judgement, Awareness, Problem solving                 Orientation Level: Disoriented to, Time Current Attention Level: Focused Memory: Decreased recall of precautions, Decreased short-term memory Following Commands: Follows one step commands consistently, Follows multi-step commands inconsistently Safety/Judgement: Decreased awareness of safety, Decreased awareness of deficits Awareness: Intellectual Problem Solving: Difficulty sequencing, Requires verbal cues          General Comments General comments (skin integrity, edema, etc.): VSS on  RA    Exercises     Assessment/Plan    PT Assessment Patient needs continued PT services  PT Problem List Decreased strength;Decreased activity tolerance;Decreased balance;Decreased mobility;Decreased coordination;Decreased cognition;Decreased knowledge of use of DME;Decreased safety awareness;Decreased knowledge of precautions;Impaired sensation       PT Treatment Interventions DME instruction;Gait training;Stair training;Functional mobility training;Therapeutic activities;Therapeutic exercise;Balance training;Neuromuscular re-education;Patient/family education;Cognitive remediation    PT Goals (Current goals can be found in the Care Plan section)  Acute Rehab PT Goals Patient Stated Goal: to improve walking quality PT Goal Formulation: With patient Time For Goal Achievement: 03/06/23 Potential to Achieve Goals: Fair    Frequency Min 4X/week     Co-evaluation               AM-PAC PT "6 Clicks" Mobility  Outcome Measure Help needed turning from your back to your side while in a flat bed without using bedrails?: A Little Help needed moving from lying on your back to sitting on the side of a flat bed without using bedrails?: A Little Help needed moving to and from a bed to a chair (including a wheelchair)?: A Lot Help needed standing up from a chair using your arms (e.g., wheelchair or bedside chair)?: A Little Help needed to walk in hospital room?: Total Help needed climbing 3-5 steps with a railing? : Total 6 Click Score: 13    End of Session Equipment Utilized During Treatment: Gait belt Activity Tolerance: Patient tolerated treatment well Patient left: in bed;with call bell/phone within reach;with bed alarm set (telesitter in room) Nurse Communication: Mobility status PT Visit Diagnosis: Other abnormalities of gait and mobility (R26.89);Muscle weakness (generalized) (M62.81);Other symptoms and signs involving the nervous system (R29.898);Hemiplegia and  hemiparesis Hemiplegia - Right/Left: Left Hemiplegia -  dominant/non-dominant: Non-dominant Hemiplegia - caused by: Cerebral infarction    Time: 0924-0950 PT Time Calculation (min) (ACUTE ONLY): 26 min   Charges:   PT Evaluation $PT Eval Low Complexity: 1 Low          Arlyss Gandy, PT, DPT Acute Rehabilitation Office [phone]   Arlyss Gandy 02/20/2023, 10:01 AM

## 2023-02-20 NOTE — Evaluation (Signed)
Occupational Therapy Evaluation Patient Details Name: Cameron Barton MRN: [id number] DOB: January 03, 1929 Today's Date: 02/20/2023   History of Present Illness 89 you male admitted with L side weakness MRI (+) acute ischemic infarcts R basal ganglia and R temporal lobe, probable meningioma overlying the parasagittal anterior R frontal convexity PMH HA falls   Clinical Impression   PT admitted with Cva. Pt currently with functional limitiations due to the deficits listed below (see OT problem list). Pt is from a home where he is temporary housing with Mrs. Dewayne Hatch ( family friend) to help with his care. Sister is actively trying to figure out long term care for the patient as she is 89 yo and caregiver Mrs Dewayne Hatch has health concerns. Pt has requested sister to be his POA assist with his care. Sister has asked that daughter not be communicated with regarding his care during my session. Pt has baseline cognition changes and requires (A) for bathing/ dressing.  Pt will benefit from skilled OT to increase their independence and safety with adls and balance to allow discharge SNF long term placement needs.       Recommendations for follow up therapy are one component of a multi-disciplinary discharge planning process, led by the attending physician.  Recommendations may be updated based on patient status, additional functional criteria and insurance authorization.   Assistance Recommended at Discharge Intermittent Supervision/Assistance  Patient can return home with the following A lot of help with walking and/or transfers;A lot of help with bathing/dressing/bathroom    Functional Status Assessment  Patient has had a recent decline in their functional status and demonstrates the ability to make significant improvements in function in a reasonable and predictable amount of time.  Equipment Recommendations  BSC/3in1;Wheelchair (measurements OT);Wheelchair cushion (measurements OT);Other (comment) (RW at SNF)     Recommendations for Other Services Other (comment) (Child psychotherapist)     Precautions / Restrictions Precautions Precautions: Fall Restrictions Weight Bearing Restrictions: No      Mobility Bed Mobility Overal bed mobility: Needs Assistance Bed Mobility: Rolling, Supine to Sit, Sit to Supine Rolling: Mod assist   Supine to sit: Min assist, HOB elevated Sit to supine: Min assist   General bed mobility comments: pt with cues to sequence task and encouragement to engage.    Transfers Overall transfer level: Needs assistance Equipment used: 1 person hand held assist Transfers: Sit to/from Stand Sit to Stand: Min assist           General transfer comment: pt with posture lean and sitting back down quickly. pt needs continued cues to sustain attention to task and progress. pt motivated internally to return to supine      Balance Overall balance assessment: Needs assistance Sitting-balance support: Bilateral upper extremity supported, Feet supported Sitting balance-Leahy Scale: Fair     Standing balance support: During functional activity, Bilateral upper extremity supported Standing balance-Leahy Scale: Poor                             ADL either performed or assessed with clinical judgement   ADL Overall ADL's : Needs assistance/impaired Eating/Feeding: Moderate assistance   Grooming: Moderate assistance   Upper Body Bathing: Moderate assistance   Lower Body Bathing: Moderate assistance           Toilet Transfer: Moderate assistance                   Vision Ability to See in Adequate Light: 1  Impaired Additional Comments: difficult to test due to cognitive deficits but functionally scanning environement and correctly reports visitors in room by relationship and name     Perception     Praxis      Pertinent Vitals/Pain Pain Assessment Pain Assessment: No/denies pain     Hand Dominance Right   Extremity/Trunk Assessment Upper  Extremity Assessment Upper Extremity Assessment: Generalized weakness;LUE deficits/detail LUE Deficits / Details: demonstrates shoulder flexion to 90 degrees, opening and closing hand with functional task. pt showing some decreased attention to L side but with encouragement does activate L side. LUE Sensation: decreased light touch LUE Coordination: decreased gross motor;decreased fine motor   Lower Extremity Assessment Lower Extremity Assessment: Defer to PT evaluation LLE Deficits / Details: grossly 4-/5 LLE Sensation: decreased light touch   Cervical / Trunk Assessment Cervical / Trunk Assessment: Normal   Communication Communication Communication: Expressive difficulties   Cognition Arousal/Alertness: Awake/alert Behavior During Therapy: Impulsive Overall Cognitive Status: Impaired/Different from baseline Area of Impairment: Orientation, Attention, Memory, Following commands, Safety/judgement, Awareness, Problem solving                 Orientation Level: Disoriented to, Place, Time (reports that he is at home) Current Attention Level: Sustained Memory: Decreased recall of precautions, Decreased short-term memory Following Commands: Follows one step commands inconsistently, Follows one step commands with increased time Safety/Judgement: Decreased awareness of safety, Decreased awareness of deficits Awareness: Intellectual Problem Solving: Slow processing, Difficulty sequencing General Comments: family reports some baseline cognition concerns. pt was able to report that he was told he had a stroke but was surprised that he was not at home. pt benefits from sister helping encourage patient during session.     General Comments  VSS on RA    Exercises     Shoulder Instructions      Home Living Family/patient expects to be discharged to:: Private residence Living Arrangements: Non-relatives/Friends Available Help at Discharge: Family;Available 24 hours/day Type of  Home: House Home Access: Level entry     Home Layout: One level     Bathroom Shower/Tub: Chief Strategy Officer: Handicapped height     Home Equipment: Hospital doctor (4 wheels)   Additional Comments: family working on facility that can provide more care      Prior Functioning/Environment Prior Level of Function : Needs assist             Mobility Comments: ambulates with Rollator with Mrs. Ann with him and driving ADLs Comments: food is prepared for him by Lacinda Axon. Walks with Rollator to attend church and Statistician. Gets help with dressing and bath        OT Problem List: Decreased strength;Decreased activity tolerance;Impaired balance (sitting and/or standing);Decreased cognition;Decreased safety awareness;Decreased knowledge of use of DME or AE;Decreased knowledge of precautions      OT Treatment/Interventions: Self-care/ADL training;Therapeutic exercise;DME and/or AE instruction;Manual therapy;Therapeutic activities;Patient/family education;Balance training;Cognitive remediation/compensation;Neuromuscular education    OT Goals(Current goals can be found in the care plan section) Acute Rehab OT Goals Patient Stated Goal: none stated by patient OT Goal Formulation: With patient/family Time For Goal Achievement: 03/06/23 Potential to Achieve Goals: Good  OT Frequency: Min 2X/week    Co-evaluation              AM-PAC OT "6 Clicks" Daily Activity     Outcome Measure Help from another person eating meals?: A Little Help from another person taking care of personal grooming?: A Lot Help from another person  toileting, which includes using toliet, bedpan, or urinal?: A Lot Help from another person bathing (including washing, rinsing, drying)?: A Lot Help from another person to put on and taking off regular upper body clothing?: A Little Help from another person to put on and taking off regular lower body clothing?: A Lot 6 Click Score:  14   End of Session Nurse Communication: Mobility status;Precautions  Activity Tolerance: Patient tolerated treatment well Patient left: in bed;with call bell/phone within reach;with bed alarm set;with family/visitor present  OT Visit Diagnosis: Unsteadiness on feet (R26.81);Muscle weakness (generalized) (M62.81)                Time: 1610-9604 OT Time Calculation (min): 34 min Charges:  OT General Charges $OT Visit: 1 Visit OT Evaluation $OT Eval Moderate Complexity: 1 Mod OT Treatments $Self Care/Home Management : 8-22 mins   Brynn, OTR/L  Acute Rehabilitation Services Office: [phone] .   Mateo Flow 02/20/2023, 1:14 PM

## 2023-02-21 DIAGNOSIS — R531 Weakness: Secondary | ICD-10-CM | POA: Diagnosis not present

## 2023-02-21 NOTE — Evaluation (Signed)
Speech Language Pathology Evaluation Patient Details Name: Cameron Barton MRN: [id number] DOB: [birth year] Today's Date: 02/21/2023 Time: 4098-1191 SLP Time Calculation (min) (ACUTE ONLY): 15 min  Problem List:  Patient Active Problem List   Diagnosis Date Noted   Delirium 02/20/2023   CVA (cerebral vascular accident) (HCC) 02/20/2023   Left-sided weakness 02/19/2023   Inguinal hernia of left side with obstruction 10/02/2021   Hernia, inguinal 10/02/2021   Past Medical History:  Past Medical History:  Diagnosis Date   Arthritis    Past Surgical History:  Past Surgical History:  Procedure Laterality Date   HERNIA REPAIR     INGUINAL HERNIA REPAIR Left 10/02/2021   Procedure: HERNIA REPAIR INGUINAL INCARCERATED;  Surgeon: Romie Levee, MD;  Location: [facility];  Service: General;  Laterality: Left;   HPI:  73 you male admitted with L side weakness MRI (+) acute ischemic infarcts R basal ganglia and R temporal lobe, probable meningioma overlying the parasagittal anterior R frontal convexity PMH HA falls   Assessment / Plan / Recommendation Clinical Impression  Pt seen for speech-language-cognitive assessment. Spoke with sister via phone who reports pt has periods of confusion prior to this admission and his speech is slurred more than baseline. Pt states he does not keep up with the year;  he thought the month was November but he knew he was in the hospital and had a stroke.He demonstrated decreased problem solving for basic information (using the bathroom and calling nurse). Pt with reduced divergent naming stating 3 animals in one minute. He accurately stated the name of his caregiver. His cognition, overall appears close to or at his baseline however will follow briefly for dysarthria intervention.    SLP Assessment  SLP Recommendation/Assessment: Patient needs continued Speech Lanaguage Pathology Services SLP Visit Diagnosis: Dysarthria and anarthria (R47.1)    Recommendations  for follow up therapy are one component of a multi-disciplinary discharge planning process, led by the attending physician.  Recommendations may be updated based on patient status, additional functional criteria and insurance authorization.    Follow Up Recommendations  Skilled nursing-short term rehab (<3 hours/day)    Assistance Recommended at Discharge  Intermittent Supervision/Assistance  Functional Status Assessment Patient has had a recent decline in their functional status and demonstrates the ability to make significant improvements in function in a reasonable and predictable amount of time.  Frequency and Duration min 1 x/week  2 weeks      SLP Evaluation Cognition  Overall Cognitive Status: History of cognitive impairments - at baseline (sister states may be little worse now, speech is more slurred) Arousal/Alertness: Awake/alert Orientation Level: Oriented to person;Oriented to place;Oriented to situation;Disoriented to time Year:  (does not keep up with) Month: November Attention: Sustained Sustained Attention: Appears intact Memory:  (deficits at baseline) Awareness:  (to assess further) Problem Solving: Impaired Problem Solving Impairment: Functional basic;Verbal basic Safety/Judgment: Impaired       Comprehension  Auditory Comprehension Overall Auditory Comprehension: Appears within functional limits for tasks assessed Commands: Within Functional Limits Visual Recognition/Discrimination Discrimination: Not tested Reading Comprehension Reading Status: Not tested    Expression Expression Primary Mode of Expression: Verbal Verbal Expression Overall Verbal Expression: Appears within functional limits for tasks assessed Level of Generative/Spontaneous Verbalization: Conversation Repetition:  (NT) Naming:  (named 3 animals) Pragmatics: No impairment Written Expression Dominant Hand: Right Written Expression: Not tested   Oral / Motor  Oral Motor/Sensory  Function Overall Oral Motor/Sensory Function: Within functional limits Motor Speech Overall Motor Speech: Impaired Respiration: Within  functional limits Phonation: Normal Resonance: Within functional limits Articulation: Impaired Level of Impairment: Sentence Intelligibility: Intelligibility reduced Word: 75-100% accurate Phrase: 50-74% accurate Sentence: 50-74% accurate Conversation: 50-74% accurate Motor Planning: Witnin functional limits            Royce Macadamia 02/21/2023, 11:43 AM

## 2023-02-21 NOTE — Progress Notes (Signed)
Spoke with sister Gillian Shields [facility]).  States that family has struggled with keeping up with his care needs at home.  Sister previously took care of him, but she started having her own healthcare needs and was unable to so then he moved in with a friend, Corena Pilgrim, who help take care of him but she is also now unable to keep up with his care needs.  I discussed that PT will see patient and we can get him connected with the appropriate level of care prior to discharge.  Sister was appreciative.  We discussed code status.  She says she thought about it and would like to change him to DNR DNI.  I also offered palliative consult, which she is agreeable to.

## 2023-02-21 NOTE — TOC Progression Note (Signed)
Transition of Care [facility]) - Progression Note    Patient Details  Name: Cameron Barton MRN: [id number] Date of Birth: 1929-07-04  Transition of Care [facility]) CM/SW Contact  Carley Hammed, LCSW Phone Number: 02/21/2023, 12:09 PM  Clinical Narrative:    CSW noted pt has bed offers. CSW followed up with each facility and advised that long term care may be the final disposition. All facilities pulled their offer accept for [place] and [place]. They noted pt would have to admit as SNF and then switch to LTC. Pt currently with sitter and awaiting palliative consult. TOC will continue to follow and assist with disposition when more appropriate.    Expected Discharge Plan: Skilled Nursing Facility    Expected Discharge Plan and Services                                               Social Determinants of Health (SDOH) Interventions SDOH Screenings   Food Insecurity: No Food Insecurity (02/20/2023)  Housing: Low Risk  (02/20/2023)  Transportation Needs: No Transportation Needs (02/20/2023)  Utilities: Not At Risk (02/20/2023)  Tobacco Use: Medium Risk (02/19/2023)    Readmission Risk Interventions     No data to display

## 2023-02-21 NOTE — Progress Notes (Signed)
Daily Progress Note Intern Pager: [phone]  Patient name: Cameron Barton Medical record number: [id number] Date of birth: 1929/08/03 Age: 89 y.o. Gender: male  Primary Care Provider: System, Provider Not In Consultants: Palliative Code Status: DNR/DNI  Pt Overview and Major Events to Date:  5/13 Admitted 5/15 Code changed to DNR/DNI  Assessment and Plan: AD is a 89yo M admitted for ischemic stroke of R basal ganglia and R temporal lobe.    Pertinent PMH/PSH includes hernia repair.   * Left-sided weakness MRI c/w infarct of R basal ganglia and temporal lobe. EEG, echo, DVT US unremarkable. Furthermore, spoke to HPOA sister and pt is now DNR/DNI. - Neurology consulted, appreciate recs - Palliative consulted, appreciate recs - Continue aspirin 81mg  daily - Continue Plavix 75mg  daily for 3 weeks (last dose 6/4) - Cont rosuvastatin 20mg  daily  - Continuous cardiac monitoring - PT/OT eval and treat - SLP eval - Neurochecks Q2H - Fall precaution  Delirium Was redirectable with food. - If agitation persists, consider starting nightly Seroquel 25mg . Avoid ativan if possible.  - Delirium precaution   FEN/GI: Regular PPx: Lovenox Dispo:SNF in 2-3 days. TOC is working to get SNF placement as pt will likely need LTC.   Subjective:  Pt doing well this, pleasantly demented.  See my separate note 5/15 about discussion with sister (HPOA)  Objective: Temp:  [97.6 F (36.4 C)-99.2 F (37.3 C)] 98.1 F (36.7 C) (05/15 1114) Pulse Rate:  [48-68] 64 (05/15 1114) Resp:  [18-22] 20 (05/15 1114) BP: (131-147)/(78-98) 134/78 (05/15 1114) SpO2:  [97 %-100 %] 99 % (05/15 1114) Physical Exam: General: Alert, pleasantly demented older man lying comfortably in bed.  NAD Neruo: Alert and oriented to self, place (hospitalization), but not oriented to time.  Cardiovascular: RRR, no murmurs. Respiratory: Normal WOB on RA Abdomen: Soft, nontender, nondistended Skin: Warm, well  perfused  Laboratory: Most recent CBC Lab Results  Component Value Date   WBC 5.4 02/19/2023   HGB 16.3 02/19/2023   HCT 48.0 02/19/2023   MCV 84.4 02/19/2023   PLT 395 02/19/2023   Most recent BMP    Latest Ref Rng & Units 02/19/2023    8:27 PM  BMP  Glucose 70 - 99 mg/dL 91   BUN 8 - 23 mg/dL 11   Creatinine 1.47 - 1.24 mg/dL 8.29   Sodium 562 - 130 mmol/L 137   Potassium 3.5 - 5.1 mmol/L 4.4   Chloride 98 - 111 mmol/L 102   CO2 22 - 32 mmol/L 24   Calcium 8.9 - 10.3 mg/dL 9.2    Imaging/Diagnostic Tests:  Echo 1. Left ventricular ejection fraction, by estimation, is 55 to 60%. The  left ventricle has normal function. The left ventricle has no regional  wall motion abnormalities. Left ventricular diastolic parameters are  indeterminate.   2. Right ventricular systolic function is normal. The right ventricular  size is normal.   3. The mitral valve is normal in structure. Mild mitral valve  regurgitation. No evidence of mitral stenosis.   4. Tricuspid valve regurgitation is mild to moderate.   5. The aortic valve is normal in structure. There is mild calcification  of the aortic valve. Aortic valve regurgitation is not visualized. Aortic  valve sclerosis is present, with no evidence of aortic valve stenosis.   6. The inferior vena cava is normal in size with greater than 50%  respiratory variability, suggesting right atrial pressure of 3 mmHg.   DVT US - No  evidence of deep vein thrombosis seen in the lower extremities,  bilaterally.  -No evidence of popliteal cyst, bilaterally.     Lincoln Brigham, MD 02/21/2023, 4:27 PM  PGY-1, [facility] FPTS Intern pager: [phone], text pages welcome Secure chat group [facility] Teaching Service

## 2023-02-21 NOTE — Plan of Care (Signed)
  Problem: Education: Goal: Knowledge of disease or condition will improve Outcome: Progressing Goal: Knowledge of secondary prevention will improve (MUST DOCUMENT ALL) Outcome: Progressing Goal: Knowledge of patient specific risk factors will improve (Mark N/A or DELETE if not current risk factor) Outcome: Progressing   Problem: Ischemic Stroke/TIA Tissue Perfusion: Goal: Complications of ischemic stroke/TIA will be minimized Outcome: Progressing   Problem: Coping: Goal: Will verbalize positive feelings about self Outcome: Progressing Goal: Will identify appropriate support needs Outcome: Progressing   Problem: Health Behavior/Discharge Planning: Goal: Ability to manage health-related needs will improve Outcome: Progressing Goal: Goals will be collaboratively established with patient/family Outcome: Progressing   Problem: Self-Care: Goal: Ability to participate in self-care as condition permits will improve Outcome: Progressing Goal: Verbalization of feelings and concerns over difficulty with self-care will improve Outcome: Progressing Goal: Ability to communicate needs accurately will improve Outcome: Progressing   Problem: Nutrition: Goal: Risk of aspiration will decrease Outcome: Progressing Goal: Dietary intake will improve Outcome: Progressing   

## 2023-02-21 NOTE — Progress Notes (Signed)
Physical Therapy Treatment Patient Details Name: Cameron Barton MRN: [id number] DOB: 05-13-1929 Today's Date: 02/21/2023   History of Present Illness 26 you male admitted with L side weakness MRI (+) acute ischemic infarcts R basal ganglia and R temporal lobe, probable meningioma overlying the parasagittal anterior R frontal convexity PMH HA falls    PT Comments    Pt is making good, gradual progress with functional mobility, ambulating up to ~38 ft at a time today. However, he displays deficits in L lower extremity coordination and strength that impacts his balance and safety with functional mobility. He ambulates with an ataxic, scissoring gait pattern with decreased L step length and foot clearance, needing extensive multi-modal cues and modA to prevent LOB. Pt also has poor safety awareness, often sitting prematurely. Will continue to follow acutely.     Recommendations for follow up therapy are one component of a multi-disciplinary discharge planning process, led by the attending physician.  Recommendations may be updated based on patient status, additional functional criteria and insurance authorization.  Follow Up Recommendations  Can patient physically be transported by private vehicle: Yes    Assistance Recommended at Discharge Frequent or constant Supervision/Assistance  Patient can return home with the following A lot of help with walking and/or transfers;A lot of help with bathing/dressing/bathroom;Assistance with cooking/housework;Assistance with feeding;Direct supervision/assist for medications management;Direct supervision/assist for financial management;Assist for transportation;Help with stairs or ramp for entrance   Equipment Recommendations  Wheelchair (measurements PT);Hospital bed;BSC/3in1    Recommendations for Other Services       Precautions / Restrictions Precautions Precautions: Fall Restrictions Weight Bearing Restrictions: No     Mobility  Bed  Mobility Overal bed mobility: Needs Assistance Bed Mobility: Supine to Sit, Sit to Supine     Supine to sit: HOB elevated, Min guard Sit to supine: Min assist, HOB elevated   General bed mobility comments: Extra time and repeated verbal and tactile cues to bring bil legs off L EOB to sit up, minguard for safety. MinA to lift legs onto bed with return to supine.    Transfers Overall transfer level: Needs assistance Equipment used: Rolling walker (2 wheels), 1 person hand held assist Transfers: Sit to/from Stand, Bed to chair/wheelchair/BSC Sit to Stand: Min assist   Step pivot transfers: Min assist, Mod assist       General transfer comment: pt benefits from cues for hand placement along with physical assistance to ascend into standing, x1 rep from EOB and x3 reps from recliner. Min-modA to direct buttocks safely to EOB from recliner with step pivot using RW, displaying difficulty managing th eRW    Ambulation/Gait Ambulation/Gait assistance: Mod assist, +2 safety/equipment Gait Distance (Feet): 38 Feet (x4 bouts of ~10 ft > ~38 ft > ~38 ft > ~2 ft) Assistive device: Rolling walker (2 wheels) Gait Pattern/deviations: Step-to pattern, Step-through pattern, Decreased step length - left, Narrow base of support, Ataxic, Scissoring, Trunk flexed, Decreased stride length Gait velocity: reduced Gait velocity interpretation: <1.31 ft/sec, indicative of household ambulator   General Gait Details: Pt with slow, ataxic, step-to gait pattern with flexed posture and narrow BOS and intermittent scissoring. Placed therapist's foot between his intermittently when ambulating to remind pt to maintain a wider BOS. Tactile cues provided at L leg during swing to progress to a step-through pattern and at L quads during stance phase for stability. Momentary successful carryover noted. ModA throughout for balance and RW management, chair follow for safety as pt can be impulsive to sit   Stairs  Wheelchair Mobility    Modified Rankin (Stroke Patients Only) Modified Rankin (Stroke Patients Only) Pre-Morbid Rankin Score: Moderate disability Modified Rankin: Moderately severe disability     Balance Overall balance assessment: Needs assistance Sitting-balance support: No upper extremity supported, Feet supported Sitting balance-Leahy Scale: Fair     Standing balance support: Bilateral upper extremity supported, Reliant on assistive device for balance Standing balance-Leahy Scale: Poor Standing balance comment: minA for static standing, modA to ambulate with RW                            Cognition Arousal/Alertness: Awake/alert Behavior During Therapy: Impulsive   Area of Impairment: Attention, Memory, Following commands, Safety/judgement, Awareness, Problem solving                   Current Attention Level: Sustained Memory: Decreased recall of precautions, Decreased short-term memory Following Commands: Follows one step commands consistently, Follows multi-step commands inconsistently, Follows one step commands with increased time Safety/Judgement: Decreased awareness of safety, Decreased awareness of deficits Awareness: Intellectual Problem Solving: Difficulty sequencing, Requires verbal cues, Slow processing General Comments: Pt can be impulsive to sit. Poor motor planning at times, seeming to be unable to coordinate taking steps when moving the RW in tight spaces. Follows simple single step commands with extra time.        Exercises      General Comments        Pertinent Vitals/Pain Pain Assessment Pain Assessment: Faces Faces Pain Scale: No hurt Pain Intervention(s): Monitored during session    Home Living                          Prior Function            PT Goals (current goals can now be found in the care plan section) Acute Rehab PT Goals Patient Stated Goal: to improve walking quality PT Goal Formulation:  With patient/family Time For Goal Achievement: 03/06/23 Potential to Achieve Goals: Fair Progress towards PT goals: Progressing toward goals    Frequency    Min 4X/week      PT Plan Current plan remains appropriate    Co-evaluation              AM-PAC PT "6 Clicks" Mobility   Outcome Measure  Help needed turning from your back to your side while in a flat bed without using bedrails?: A Little Help needed moving from lying on your back to sitting on the side of a flat bed without using bedrails?: A Little Help needed moving to and from a bed to a chair (including a wheelchair)?: A Lot Help needed standing up from a chair using your arms (e.g., wheelchair or bedside chair)?: A Little Help needed to walk in hospital room?: A Lot Help needed climbing 3-5 steps with a railing? : Total 6 Click Score: 14    End of Session Equipment Utilized During Treatment: Gait belt Activity Tolerance: Patient tolerated treatment well Patient left: in bed;with call bell/phone within reach;with bed alarm set;with family/visitor present Nurse Communication: Mobility status PT Visit Diagnosis: Other abnormalities of gait and mobility (R26.89);Muscle weakness (generalized) (M62.81);Other symptoms and signs involving the nervous system (R29.898);Hemiplegia and hemiparesis;Unsteadiness on feet (R26.81);Difficulty in walking, not elsewhere classified (R26.2) Hemiplegia - Right/Left: Left Hemiplegia - dominant/non-dominant: Non-dominant Hemiplegia - caused by: Cerebral infarction     Time: 1528-1550 PT Time Calculation (min) (ACUTE ONLY): 22 min  Charges:  $Gait Training: 8-22 mins                     Raymond Gurney, PT, DPT Acute Rehabilitation Services  Office: [phone]    Jewel Baize 02/21/2023, 6:36 PM

## 2023-02-22 DIAGNOSIS — I639 Cerebral infarction, unspecified: Secondary | ICD-10-CM | POA: Diagnosis not present

## 2023-02-22 LAB — BASIC METABOLIC PANEL
Anion gap: 9 (ref 5–15)
BUN: 14 mg/dL (ref 8–23)
CO2: 24 mmol/L (ref 22–32)
Calcium: 9 mg/dL (ref 8.9–10.3)
Chloride: 103 mmol/L (ref 98–111)
Creatinine, Ser: 0.88 mg/dL (ref 0.61–1.24)
GFR, Estimated: >60 mL/min (ref >60)
Glucose, Bld: 91 mg/dL (ref 70–99)
Potassium: 3.8 mmol/L (ref 3.5–5.1)
Sodium: 136 mmol/L (ref 135–145)

## 2023-02-22 NOTE — Plan of Care (Signed)
  Problem: Education: Goal: Knowledge of disease or condition will improve Outcome: Progressing Goal: Knowledge of secondary prevention will improve (MUST DOCUMENT ALL) Outcome: Progressing Goal: Knowledge of patient specific risk factors will improve (Mark N/A or DELETE if not current risk factor) Outcome: Progressing   Problem: Ischemic Stroke/TIA Tissue Perfusion: Goal: Complications of ischemic stroke/TIA will be minimized Outcome: Progressing   Problem: Coping: Goal: Will verbalize positive feelings about self Outcome: Progressing Goal: Will identify appropriate support needs Outcome: Progressing   Problem: Health Behavior/Discharge Planning: Goal: Ability to manage health-related needs will improve Outcome: Progressing Goal: Goals will be collaboratively established with patient/family Outcome: Progressing   Problem: Self-Care: Goal: Ability to participate in self-care as condition permits will improve Outcome: Progressing Goal: Verbalization of feelings and concerns over difficulty with self-care will improve Outcome: Progressing Goal: Ability to communicate needs accurately will improve Outcome: Progressing   Problem: Nutrition: Goal: Risk of aspiration will decrease Outcome: Progressing Goal: Dietary intake will improve Outcome: Progressing   

## 2023-02-22 NOTE — Progress Notes (Signed)
Physical Therapy Treatment Patient Details Name: Cameron Barton MRN: [id number] DOB: 09-27-1929 Today's Date: 02/22/2023   History of Present Illness 63 you male admitted with L side weakness MRI (+) acute ischemic infarcts R basal ganglia and R temporal lobe, probable meningioma overlying the parasagittal anterior R frontal convexity PMH HA falls    PT Comments    Pt is not taking scissoring steps as often, but is still maintaining a narrow BOS and demonstrating deficits in coordination and motor planning. He required min-modA to ambulate with a RW, needing multi-modal cues to improve his L step length and foot clearance, stance width, RW management, and balance. Pt still remains mildly impulsive to sit before ensuring he is in front of a seat. Will continue to follow acutely.     Recommendations for follow up therapy are one component of a multi-disciplinary discharge planning process, led by the attending physician.  Recommendations may be updated based on patient status, additional functional criteria and insurance authorization.  Follow Up Recommendations  Can patient physically be transported by private vehicle: Yes    Assistance Recommended at Discharge Frequent or constant Supervision/Assistance  Patient can return home with the following A lot of help with walking and/or transfers;A lot of help with bathing/dressing/bathroom;Assistance with cooking/housework;Assistance with feeding;Direct supervision/assist for medications management;Direct supervision/assist for financial management;Assist for transportation;Help with stairs or ramp for entrance   Equipment Recommendations  Wheelchair (measurements PT);Hospital bed;BSC/3in1;Rolling walker (2 wheels)    Recommendations for Other Services       Precautions / Restrictions Precautions Precautions: Fall Restrictions Weight Bearing Restrictions: No     Mobility  Bed Mobility Overal bed mobility: Needs Assistance Bed  Mobility: Supine to Sit     Supine to sit: HOB elevated, Min guard     General bed mobility comments: Improved speed, but still taking ~30 seconds to sit up L EOB, needing verbal cues to bring bil legs off L EOB to sit up, minguard for safety.    Transfers Overall transfer level: Needs assistance Equipment used: Rolling walker (2 wheels) Transfers: Sit to/from Stand, Bed to chair/wheelchair/BSC Sit to Stand: Min assist   Step pivot transfers: Min assist, Mod assist       General transfer comment: Pt needing tactile and verbal cues to bring L foot posteriorly and push up with L hand on sitting surface to stand, minA to power up and gain balance and cues for L hand placement on RW once standing. Min-modA for balance and to safely direct pt's buttocks to commode with transfer via step pivot to L bed > commode using RW for support.    Ambulation/Gait Ambulation/Gait assistance: Mod assist, Min assist Gait Distance (Feet): 35 Feet (x3 bouts of ~2 ft > ~10 ft > ~35 ft) Assistive device: Rolling walker (2 wheels) Gait Pattern/deviations: Step-to pattern, Step-through pattern, Decreased step length - left, Narrow base of support, Ataxic, Trunk flexed, Decreased stride length, Decreased dorsiflexion - left Gait velocity: reduced Gait velocity interpretation: <1.31 ft/sec, indicative of household ambulator   General Gait Details: Pt demonstrating less scissoring, yet still narrow BOS. Pt also displaying improved L step length, but still alternates between step-to and step-through gait pattern. Verbal, tactile, and visual cues provided to widen BOS, stand upright, keep RW proximal, and advance L leg and clear L foot when stepping. Min-modA for L leg and RW management and balance.   Stairs             Wheelchair Mobility    Modified Rankin (  Stroke Patients Only) Modified Rankin (Stroke Patients Only) Pre-Morbid Rankin Score: Moderate disability Modified Rankin: Moderately severe  disability     Balance Overall balance assessment: Needs assistance Sitting-balance support: No upper extremity supported, Feet supported Sitting balance-Leahy Scale: Fair     Standing balance support: Bilateral upper extremity supported, Reliant on assistive device for balance Standing balance-Leahy Scale: Poor Standing balance comment: min-modA for static standing and to ambulate with RW                            Cognition Arousal/Alertness: Awake/alert Behavior During Therapy: Restless, Impulsive Overall Cognitive Status: History of cognitive impairments - at baseline Area of Impairment: Attention, Memory, Following commands, Safety/judgement, Awareness, Problem solving                   Current Attention Level: Sustained Memory: Decreased recall of precautions, Decreased short-term memory Following Commands: Follows one step commands consistently, Follows multi-step commands inconsistently, Follows one step commands with increased time Safety/Judgement: Decreased awareness of safety, Decreased awareness of deficits Awareness: Intellectual Problem Solving: Difficulty sequencing, Requires verbal cues, Slow processing General Comments: Pt can be impulsive to sit and restless to play with linens. Poor motor planning at times. Follows simple single step commands with extra time.        Exercises General Exercises - Upper Extremity Shoulder Flexion: AAROM, Left, 10 reps, Seated (<90')    General Comments        Pertinent Vitals/Pain Pain Assessment Pain Assessment: Faces Faces Pain Scale: No hurt Pain Intervention(s): Monitored during session    Home Living                          Prior Function            PT Goals (current goals can now be found in the care plan section) Acute Rehab PT Goals Patient Stated Goal: to improve walking quality PT Goal Formulation: With patient Time For Goal Achievement: 03/06/23 Potential to Achieve  Goals: Fair Progress towards PT goals: Progressing toward goals    Frequency    Min 3X/week      PT Plan Frequency needs to be updated;Equipment recommendations need to be updated    Co-evaluation              AM-PAC PT "6 Clicks" Mobility   Outcome Measure  Help needed turning from your back to your side while in a flat bed without using bedrails?: A Little Help needed moving from lying on your back to sitting on the side of a flat bed without using bedrails?: A Little Help needed moving to and from a bed to a chair (including a wheelchair)?: A Lot Help needed standing up from a chair using your arms (e.g., wheelchair or bedside chair)?: A Little Help needed to walk in hospital room?: A Lot Help needed climbing 3-5 steps with a railing? : Total 6 Click Score: 14    End of Session Equipment Utilized During Treatment: Gait belt Activity Tolerance: Patient tolerated treatment well Patient left: with call bell/phone within reach;in chair;with chair alarm set Nurse Communication: Mobility status;Other (comment) (condom cath fell off) PT Visit Diagnosis: Other abnormalities of gait and mobility (R26.89);Muscle weakness (generalized) (M62.81);Other symptoms and signs involving the nervous system (R29.898);Hemiplegia and hemiparesis;Unsteadiness on feet (R26.81);Difficulty in walking, not elsewhere classified (R26.2) Hemiplegia - Right/Left: Left Hemiplegia - dominant/non-dominant: Non-dominant Hemiplegia - caused by: Cerebral infarction  Time: 6387-5643 PT Time Calculation (min) (ACUTE ONLY): 32 min  Charges:  $Gait Training: 8-22 mins $Therapeutic Activity: 8-22 mins                     Raymond Gurney, PT, DPT Acute Rehabilitation Services  Office: [phone]    Jewel Baize 02/22/2023, 12:19 PM

## 2023-02-22 NOTE — Progress Notes (Signed)
     Daily Progress Note Intern Pager: [phone]  Patient name: Cameron Barton Medical record number: [id number] Date of birth: [birth year] Age: 89 y.o. Gender: male  Primary Care Provider: System, Provider Not In Consultants: none Code Status: DNR/DNI  Pt Overview and Major Events to Date:  5/13 Admitted 5/15 Code changed to DNR/DNI  Assessment and Plan: AD is a 89yo M admitted for ischemic stroke of R basal ganglia and R temporal lobe. Pt is medically stable for discharge.   Pertinent PMH/PSH includes hernia repair.   * Left-sided weakness - Neurology signed off - Continue aspirin 81mg  daily - Continue Plavix 75mg  daily for 3 weeks (last dose 6/4) - Cont rosuvastatin 20mg  daily  - Consider outpt palliative consult - Will f/u outpt w. Neuro in 4wks  - they reocmmend outpt cardiac monitoring to workup potential afib - Neurochecks Q2H - Fall precaution  Dementia (HCC) Pt redirectable. Telesitter discontinued. - Delirium precaution   FEN/GI: Regular PPx: Lovenox Dispo:SNF tomorrow. Pt is now medically stable for discharge.  Subjective:  NAEO.  Resting peacefully in bed.  Objective: Temp:  [98.6 F (37 C)-99.4 F (37.4 C)] 98.6 F (37 C) (05/16 0733) Pulse Rate:  [66-78] 70 (05/16 0733) Resp:  [16-18] 18 (05/16 0733) BP: (101-115)/(65-70) 101/69 (05/16 0733) SpO2:  [95 %-100 %] 95 % (05/16 0733) Physical Exam: General: Sleeping older man lying comfortably in bed, no acute distress. Cardiovascular: RRR, no murmurs. Respiratory: Normal WOB on RA. CTAB.  Abdomen: BS present. Skin: Warm, well perfused.   Laboratory: Most recent CBC Lab Results  Component Value Date   WBC 5.4 02/19/2023   HGB 16.3 02/19/2023   HCT 48.0 02/19/2023   MCV 84.4 02/19/2023   PLT 395 02/19/2023   Most recent BMP    Latest Ref Rng & Units 02/22/2023    4:38 AM  BMP  Glucose 70 - 99 mg/dL 91   BUN 8 - 23 mg/dL 14   Creatinine 1.47 - 1.24 mg/dL 8.29   Sodium 562 - 130 mmol/L  136   Potassium 3.5 - 5.1 mmol/L 3.8   Chloride 98 - 111 mmol/L 103   CO2 22 - 32 mmol/L 24   Calcium 8.9 - 10.3 mg/dL 9.0     Lincoln Brigham, MD 02/22/2023, 12:41 PM  PGY-1, [facility] FPTS Intern pager: [phone], text pages welcome Secure chat group [facility] Teaching Service

## 2023-02-22 NOTE — Progress Notes (Signed)
Occupational Therapy Treatment Patient Details Name: Cameron Barton MRN: [id number] DOB: 1929-01-22 Today's Date: 02/22/2023   History of present illness 56 you male admitted with L side weakness MRI (+) acute ischemic infarcts R basal ganglia and R temporal lobe, probable meningioma overlying the parasagittal anterior R frontal convexity PMH HA falls   OT comments  Pt making slow progress with functional goals. Pt in bed upon arrival and agreeable to OOB activity. Pt sat EOB min guard A, stood form bed to RW mod - I min A with multimodal cues for safe hand placement. Multimodal cues also required for hand placement and to pick up L foot and to manage RW, especially around corners and to stay inside the RW during functional mobility walking to bathroom. Pt attempting to sit on toilet and in chair before reaching safe and appopriate postitoning in front of seating. OT will continue to follow acutely to maximize level of function   Recommendations for follow up therapy are one component of a multi-disciplinary discharge planning process, led by the attending physician.  Recommendations may be updated based on patient status, additional functional criteria and insurance authorization.    Assistance Recommended at Discharge Frequent or constant Supervision/Assistance  Patient can return home with the following  A lot of help with walking and/or transfers;A lot of help with bathing/dressing/bathroom;Direct supervision/assist for medications management;Assist for transportation;Direct supervision/assist for financial management;Help with stairs or ramp for entrance   Equipment Recommendations  BSC/3in1;Wheelchair (measurements OT);Wheelchair cushion (measurements OT);Other (comment) (RW)    Recommendations for Other Services      Precautions / Restrictions Precautions Precautions: Fall Restrictions Weight Bearing Restrictions: No       Mobility Bed Mobility Overal bed mobility: Needs  Assistance Bed Mobility: Supine to Sit     Supine to sit: HOB elevated, Min guard          Transfers Overall transfer level: Needs assistance Equipment used: Rolling walker (2 wheels) Transfers: Sit to/from Stand, Bed to chair/wheelchair/BSC Sit to Stand: Min assist     Step pivot transfers: Min assist, Mod assist     General transfer comment: multimodal cues required for hand placement and to pick up L foot and to manage RW, especially around corners and to stay inside the RW during functional mobility walking to bathroom. Pt attempting to sit on toilet and in chair before reaching safe and appopriate postitoning in front of seating     Balance Overall balance assessment: Needs assistance Sitting-balance support: No upper extremity supported, Feet supported Sitting balance-Leahy Scale: Fair     Standing balance support: Bilateral upper extremity supported, Reliant on assistive device for balance, During functional activity Standing balance-Leahy Scale: Poor Standing balance comment: min - mod A standing at sink during hygiene tasks, multimodal cues for safety and sequencing                           ADL either performed or assessed with clinical judgement   ADL Overall ADL's : Needs assistance/impaired     Grooming: Wash/dry hands;Wash/dry face;Minimal assistance;Standing Grooming Details (indicate cue type and reason): min - mod  for balance         Upper Body Dressing : Minimal assistance;Sitting       Toilet Transfer: Minimal assistance;Ambulation;Rolling walker (2 wheels);Regular Toilet;Grab bars;Cueing for safety;Cueing for sequencing   Toileting- Clothing Manipulation and Hygiene: Moderate assistance       Functional mobility during ADLs: Moderate assistance;Minimal assistance;Cueing for safety;Cueing  for sequencing;Rolling walker (2 wheels)      Extremity/Trunk Assessment Upper Extremity Assessment Upper Extremity Assessment: Generalized  weakness;LUE deficits/detail   Lower Extremity Assessment Lower Extremity Assessment: Defer to PT evaluation   Cervical / Trunk Assessment Cervical / Trunk Assessment: Kyphotic    Vision Ability to See in Adequate Light: 1 Impaired Patient Visual Report: Other (comment) (L side inattention)     Perception     Praxis      Cognition Arousal/Alertness: Awake/alert Behavior During Therapy: Impulsive Overall Cognitive Status: History of cognitive impairments - at baseline Area of Impairment: Attention, Memory, Following commands, Safety/judgement, Awareness, Problem solving                 Orientation Level: Disoriented to, Place, Time   Memory: Decreased recall of precautions, Decreased short-term memory Following Commands: Follows one step commands consistently, Follows multi-step commands inconsistently, Follows one step commands with increased time Safety/Judgement: Decreased awareness of safety, Decreased awareness of deficits   Problem Solving: Difficulty sequencing, Requires verbal cues, Slow processing General Comments: Poor motor planning at times. Follows simple single step commands with extra time.        Exercises      Shoulder Instructions       General Comments      Pertinent Vitals/ Pain       Pain Assessment Pain Assessment: No/denies pain Faces Pain Scale: No hurt  Home Living                                          Prior Functioning/Environment              Frequency  Min 2X/week        Progress Toward Goals  OT Goals(current goals can now be found in the care plan section)  Progress towards OT goals: Progressing toward goals     Plan Discharge plan remains appropriate    Co-evaluation                 AM-PAC OT "6 Clicks" Daily Activity     Outcome Measure   Help from another person eating meals?: None Help from another person taking care of personal grooming?: A Little Help from another  person toileting, which includes using toliet, bedpan, or urinal?: A Lot Help from another person bathing (including washing, rinsing, drying)?: A Lot Help from another person to put on and taking off regular upper body clothing?: A Little Help from another person to put on and taking off regular lower body clothing?: A Lot 6 Click Score: 16    End of Session Equipment Utilized During Treatment: Gait belt;Rolling walker (2 wheels)  OT Visit Diagnosis: Unsteadiness on feet (R26.81);Muscle weakness (generalized) (M62.81)   Activity Tolerance Patient tolerated treatment well   Patient Left with call bell/phone within reach;with family/visitor present;in chair;with chair alarm set   Nurse Communication          Time: 1421-1440 OT Time Calculation (min): 19 min  Charges: OT General Charges $OT Visit: 1 Visit OT Treatments $Self Care/Home Management : 8-22 mins    Galen Manila 02/22/2023, 2:58 PM

## 2023-02-22 NOTE — TOC Progression Note (Signed)
Transition of Care [facility]) - Progression Note    Patient Details  Name: Cameron Barton MRN: [id number] Date of Birth: 05-Dec-1928  Transition of [facility]) CM/SW Contact  Erin Sons, [place] Phone Number: 02/22/2023, 2:58 PM  Clinical Narrative:     CSW called pt sister; no answer, left voicemail requesting return call.   Expected Discharge Plan: Skilled Nursing Facility    Expected Discharge Plan and Services                                               Social Determinants of Health (SDOH) Interventions SDOH Screenings   Food Insecurity: No Food Insecurity (02/20/2023)  Housing: Low Risk  (02/20/2023)  Transportation Needs: No Transportation Needs (02/20/2023)  Utilities: Not At Risk (02/20/2023)  Tobacco Use: Medium Risk (02/19/2023)    Readmission Risk Interventions     No data to display

## 2023-02-22 NOTE — Plan of Care (Signed)
Pt rested well during overnight. Vitals stable. NIH 7 stable from yesterday.  Problem: Education: Goal: Knowledge of disease or condition will improve Outcome: Progressing Goal: Knowledge of secondary prevention will improve (MUST DOCUMENT ALL) Outcome: Progressing Goal: Knowledge of patient specific risk factors will improve Loraine Leriche N/A or DELETE if not current risk factor) Outcome: Progressing   Problem: Ischemic Stroke/TIA Tissue Perfusion: Goal: Complications of ischemic stroke/TIA will be minimized Outcome: Progressing   Problem: Coping: Goal: Will verbalize positive feelings about self Outcome: Progressing Goal: Will identify appropriate support needs Outcome: Progressing   Problem: Health Behavior/Discharge Planning: Goal: Ability to manage health-related needs will improve Outcome: Progressing Goal: Goals will be collaboratively established with patient/family Outcome: Progressing   Problem: Self-Care: Goal: Ability to participate in self-care as condition permits will improve Outcome: Progressing Goal: Verbalization of feelings and concerns over difficulty with self-care will improve Outcome: Progressing Goal: Ability to communicate needs accurately will improve Outcome: Progressing   Problem: Nutrition: Goal: Risk of aspiration will decrease Outcome: Progressing Goal: Dietary intake will improve Outcome: Progressing   Problem: Education: Goal: Knowledge of General Education information will improve Description: Including pain rating scale, medication(s)/side effects and non-pharmacologic comfort measures Outcome: Progressing   Problem: Health Behavior/Discharge Planning: Goal: Ability to manage health-related needs will improve Outcome: Progressing   Problem: Clinical Measurements: Goal: Ability to maintain clinical measurements within normal limits will improve Outcome: Progressing Goal: Will remain free from infection Outcome: Progressing Goal:  Diagnostic test results will improve Outcome: Progressing Goal: Respiratory complications will improve Outcome: Progressing Goal: Cardiovascular complication will be avoided Outcome: Progressing   Problem: Activity: Goal: Risk for activity intolerance will decrease Outcome: Progressing   Problem: Nutrition: Goal: Adequate nutrition will be maintained Outcome: Progressing   Problem: Coping: Goal: Level of anxiety will decrease Outcome: Progressing   Problem: Elimination: Goal: Will not experience complications related to bowel motility Outcome: Progressing Goal: Will not experience complications related to urinary retention Outcome: Progressing   Problem: Pain Managment: Goal: General experience of comfort will improve Outcome: Progressing   Problem: Safety: Goal: Ability to remain free from injury will improve Outcome: Progressing   Problem: Skin Integrity: Goal: Risk for impaired skin integrity will decrease Outcome: Progressing

## 2023-02-23 DIAGNOSIS — I639 Cerebral infarction, unspecified: Secondary | ICD-10-CM | POA: Diagnosis not present

## 2023-02-23 MED ORDER — ROSUVASTATIN CALCIUM 20 MG PO TABS: 20.0000 mg | ORAL_TABLET | Freq: Every day | ORAL | 0 refills | Status: AC

## 2023-02-23 MED ORDER — CLOPIDOGREL BISULFATE 75 MG PO TABS: 75.0000 mg | ORAL_TABLET | Freq: Every day | ORAL | 0 refills | Status: AC

## 2023-02-23 MED ORDER — ASPIRIN 81 MG PO TBEC: 81.0000 mg | DELAYED_RELEASE_TABLET | Freq: Every day | ORAL | 0 refills | Status: AC

## 2023-02-23 NOTE — TOC Progression Note (Signed)
Transition of Care [facility]) - Progression Note    Patient Details  Name: Cameron Barton MRN: [id number] Date of Birth: 01-Jun-1929  [facility] of [facility]) CM/SW Contact  Baldemar Lenis, [place] Phone Number: 02/23/2023, 1:15 PM  Clinical Narrative:   CSW spoke with sister, Cameron Barton, to discuss SNF choices. Patient's sister does not want to have to drive far. CSW looked up her address and the SNF closest, and sister chose [facility]. CSW confirmed bed availability with [facility], and initiated request for insurance authorization. Authorization pending at this time. CSW to follow.    Expected Discharge Plan: Skilled Nursing Facility    Expected Discharge Plan and Services                                               Social Determinants of Health (SDOH) Interventions SDOH Screenings   Food Insecurity: No Food Insecurity (02/20/2023)  Housing: Low Risk  (02/20/2023)  Transportation Needs: No Transportation Needs (02/20/2023)  Utilities: Not At Risk (02/20/2023)  Tobacco Use: Medium Risk (02/19/2023)    Readmission Risk Interventions     No data to display

## 2023-02-23 NOTE — Plan of Care (Signed)
Uneventful PM shift thus far. Pt calm and cooperative overnight, NIH stable at baseline. Pt remains confused but very redirectable.

## 2023-02-23 NOTE — Plan of Care (Signed)

## 2023-02-23 NOTE — Progress Notes (Signed)
Speech Language Pathology Treatment:  (dysarthria)  Patient Details Name: Cameron Barton MRN: [id number] DOB: 10-05-1929 Today's Date: 02/23/2023 Time: 4098-1191 SLP Time Calculation (min) (ACUTE ONLY): 8 min  Assessment / Plan / Recommendation Clinical Impression  Pt seen for therapy for dysarthria. Introduced strategies to speak slow, loud, open mouth wide and pause between words. SLP demonstrated and pt needed moderate reminders to use in conversation. At end of session he was able to recall one strategy. Some of his decreased intelligibility may be due to dialectical differences. Pt likely close or at baseline and is waiting for SNF placement. Will sign off at this time.   HPI HPI: 60 you male admitted with L side weakness MRI (+) acute ischemic infarcts R basal ganglia and R temporal lobe, probable meningioma overlying the parasagittal anterior R frontal convexity PMH HA falls      SLP Plan  All goals met;Discharge SLP treatment due to (comment)      Recommendations for follow up therapy are one component of a multi-disciplinary discharge planning process, led by the attending physician.  Recommendations may be updated based on patient status, additional functional criteria and insurance authorization.    Recommendations                           Dysarthria and anarthria (R47.1)     All goals met;Discharge SLP treatment due to (comment)     Royce Macadamia  02/23/2023, 3:07 PM

## 2023-02-23 NOTE — Progress Notes (Deleted)
Family Medicine Teaching [facility] Discharge Summary  Patient name: Cameron Barton Medical record number: [id number] Date of birth: 03-28-1929 Age: 89 y.o. Gender: male Date of Admission: 02/19/2023  Date of Discharge: 02/23/23 Admitting Physician: Lincoln Brigham, MD  Primary Care Provider: System, Provider Not In Consultants: Neurology  Indication for Hospitalization: Ischemic Stroke  Brief Hospital Course:  Cameron Barton is a 89 y.o.male with a history of CVA in past who was admitted to the [facility] Teaching Service at [facility] for Acute Ischemic Stroke. His hospital course is detailed below:  Acute Ischemic Stroke  Pt presented to ED after falling off bed due to L sided weakness. Initial CT head showed no acute abnormality. MRI showed acute infarct in right basal ganglia and right temporal lobe, and 1.3cm probable meningioma overlying the parasagittal anterior right frontal convexity. MRA head showed moderate stenosis of right M1 segment. Neurology consulted. Started DAPT with ASA and Plavix for 3 weeks, then transition to ASA monotherapy indefinitely (last dose Plavix 03/13/2023). Pt started on Crestor for secondary prevention. PT evaluation recommended SNF placement. Pt was discharge to SNF and has outpt followup with Stroke clinic 4 weeks after hospitalization.   Goals of Care Pt was initially Full code on admission. Discussed goals of care with HPOA Cameron Barton) and decision was made to transition pt to DNR/DNI.  Intermittent Delirium  Dementia  Chronically demented at baseline and had some episodes of delirium during admission. Pt was redirectable and did not require prn medications for delirium during hospitalization.  Other chronic conditions were medically managed with home medications and formulary alternatives as necessary.  PCP Follow-up Recommendations:  Please send Cardiology referral for outpatient 30-day cardiac monitoring to workup potential Afib as cause of stroke.   Ensure Neurology f/ 4 weeks after hospitalization Consider outpatient GOC discussion and/or Palliative consult given acute progression of neurologic decline and new DNR/DNI status. Please ensure that pt stops Plavix after 6/4. He will continue ASA indefinitely.   Discharge Diagnoses/Problem List:  Acute ischemic stroke Dementia  Disposition: SNF  Discharge Condition: Improved and stable  Discharge Exam:  Gen: Alert, sitting up in bed. NAD. Pleasantly demented.  Neuro: Alert and oriented to self, place (hospital), and time (Friday). Strength 4/5 L UE and 5/5 R UE. HEENT: NCAT. MMM.  CV: RRR Resp: Normal WOB on RA Skin: Warm, well perfused   Significant Procedures: none  Significant Labs and Imaging:  No results for input(s): "WBC", "HGB", "HCT", "PLT" in the last 48 hours. Recent Labs  Lab 02/22/23 0438  NA 136  K 3.8  CL 103  CO2 24  GLUCOSE 91  BUN 14  CREATININE 0.88  CALCIUM 9.0   Results/Tests Pending at Time of Discharge: None  Discharge Medications:  Allergies as of 02/23/2023   No Known Allergies      Medication List     TAKE these medications    acetaminophen 500 MG tablet Commonly known as: TYLENOL Take 500 mg by mouth every 6 (six) hours as needed for moderate pain.   aspirin EC 81 MG tablet Take 1 tablet (81 mg total) by mouth daily. Swallow whole.   clopidogrel 75 MG tablet Commonly known as: PLAVIX Take 1 tablet (75 mg total) by mouth daily for 17 days.   rosuvastatin 20 MG tablet Commonly known as: CRESTOR Take 1 tablet (20 mg total) by mouth daily.        Discharge Instructions: Please refer to Patient Instructions section of EMR for full details.  Patient was counseled important signs and symptoms that should prompt return to medical care, changes in medications, dietary instructions, activity restrictions, and follow up appointments.   Follow-Up Appointments:  Follow-up Information     [facility]. Schedule an appointment as soon as possible for a visit in 1 month(s).   Specialty: Neurology Why: stroke clinic Contact information: [address] [place] [address] [phone]                Lincoln Brigham, MD 02/23/2023, 12:15 PM PGY-1, [facility] Health Family Medicine

## 2023-02-23 NOTE — Discharge Instructions (Addendum)
Dear Cameron Barton,  Thank you for letting us participate in your care. You were hospitalized for left-sided weakness and diagnosed with CVA (cerebral vascular accident) (HCC). You were treated with blood thinner medications.   POST-HOSPITAL & CARE INSTRUCTIONS Please make sure to follow-up outpatient with neurology START taking the following medications: Aspirin 81 mg daily, indefinitely. Plavix (Clopidogrel) 75 mg daily until June 3rd, 2024. Then, discontinue the medication. Crestor (Rosuvostatin) 20 mg daily. Go to your follow up appointments (listed below)   DOCTOR'S APPOINTMENT   No future appointments.  Follow-up Information     [facility]. Schedule an appointment as soon as possible for a visit in 1 month(s).   Specialty: Neurology Why: stroke clinic Contact information: [address] [place] [address] [phone]                Take care and be well!  Darral Dash, DO  Family Medicine Teaching Service Inpatient Team [place]  [facility]  [address] [place], [place] [address] [phone]

## 2023-02-23 NOTE — Care Management Important Message (Signed)
Important Message  Patient Details  Name: Cameron Barton MRN: [id number] Date of Birth: 13-Feb-1929   Medicare Important Message Given:  Yes     Sherilyn Banker 02/23/2023, 4:00 PM

## 2023-02-23 NOTE — Progress Notes (Signed)
     Daily Progress Note Intern Pager: [phone]  Patient name: Cameron Barton Medical record number: [id number] Date of birth: 19-Jan-1929 Age: 89 y.o. Gender: male  Primary Care Provider: System, Provider Not In Consultants: none Code Status: DNR/DNI  Pt Overview and Major Events to Date:  5/13 Admitted 5/15 Code changed to DNR/DNI  Assessment and Plan: AD is a 89yo M admitted for ischemic stroke of R basal ganglia and R temporal lobe. Pt is medically stable for discharge.   Pertinent PMH/PSH includes hernia repair.   Dementia (HCC) - Delirium precaution  Left-sided weakness - Neurology signed off - Continue aspirin 81mg  daily - Continue Plavix 75mg  daily for 3 weeks (last dose 6/4) - Cont rosuvastatin 20mg  daily  - Consider outpt palliative consult - Will f/u outpt w. Neuro in 4wks  - they reocmmend outpt cardiac monitoring to workup potential afib - Neurochecks Q2H - Fall precaution   FEN/GI: Regular PPx: Lovenox Dispo:SNF tomorrow. Pt is now medically stable for discharge.  Subjective:  NAEO.  Objective: Temp:  [97.9 F (36.6 C)-99.3 F (37.4 C)] 98.2 F (36.8 C) (05/17 1112) Pulse Rate:  [56-79] 75 (05/17 1112) Resp:  [16-20] 20 (05/17 1112) BP: (95-133)/(51-71) 133/71 (05/17 1112) SpO2:  [96 %-100 %] 98 % (05/17 1112) Physical Exam: Gen: Alert, sitting up in bed. NAD. Pleasantly demented.  Neuro: Alert and oriented to self, place (hospital), and time (Friday). Strength 4/5 L UE and 5/5 R UE. HEENT: NCAT. MMM.  CV: RRR Resp: Normal WOB on RA Skin: Warm, well perfused   Laboratory: Most recent CBC Lab Results  Component Value Date   WBC 5.4 02/19/2023   HGB 16.3 02/19/2023   HCT 48.0 02/19/2023   MCV 84.4 02/19/2023   PLT 395 02/19/2023   Most recent BMP    Latest Ref Rng & Units 02/22/2023    4:38 AM  BMP  Glucose 70 - 99 mg/dL 91   BUN 8 - 23 mg/dL 14   Creatinine 2.53 - 1.24 mg/dL 6.64   Sodium 403 - 474 mmol/L 136   Potassium 3.5 -  5.1 mmol/L 3.8   Chloride 98 - 111 mmol/L 103   CO2 22 - 32 mmol/L 24   Calcium 8.9 - 10.3 mg/dL 9.0    Cameron Brigham, MD 02/23/2023, 12:20 PM  PGY-1, [facility] Family Medicine FPTS Intern pager: [phone], text pages welcome Secure chat group [facility] Teaching Service

## 2023-02-24 DIAGNOSIS — G8194 Hemiplegia, unspecified affecting left nondominant side: Secondary | ICD-10-CM

## 2023-02-24 DIAGNOSIS — I639 Cerebral infarction, unspecified: Secondary | ICD-10-CM | POA: Diagnosis not present

## 2023-02-24 MED ORDER — GUAIFENESIN ER 600 MG PO TB12
600.0000 mg | ORAL_TABLET | Freq: Two times a day (BID) | ORAL | Status: AC
  Administered 2023-02-24 – 2023-02-26 (×4): 600 mg via ORAL
  Filled 2023-02-24 (×4): qty 1

## 2023-02-24 NOTE — Progress Notes (Signed)
Per Wynona Canes with [employer], auth remains pending at this time. MD updated.   Dellie Burns, MSW, LCSW [phone] (coverage)

## 2023-02-24 NOTE — Progress Notes (Signed)
     Daily Progress Note Intern Pager: [phone]  Patient name: Cameron Barton Medical record number: [id number] Date of birth: Jul 26, 1929 Age: 89 y.o. Gender: male  Primary Care Provider: System, Provider Not In Consultants: Neuro has signed off Code Status: DNR  Pt Overview and Major Events to Date:  5/13 Admitted 5/15 Code changed to DNR/DNI  Assessment and Plan: Cameron Barton is a 89yo M admitted for ischemic stroke of R basal ganglia and R temporal lobe. Pt is medically stable for discharge.   Pertinent PMH/PSH includes hernia repair, dementia.   Dementia (HCC) - Delirium precaution  Left-sided weakness - Neurology signed off - Continue aspirin 81mg  daily - Continue Plavix 75mg  daily for 3 weeks (last dose 6/4) - Cont rosuvastatin 20mg  daily  - Consider outpt palliative consult - Will f/u outpt w. Neuro in 4wks  - they reocmmend outpt cardiac monitoring to workup potential afib - Fall precaution   FEN/GI: Regular PPx: Lovenox Dispo:Medically stable for DC. Awaiting SNF insurance auth.   Subjective:  NAEO.  Objective: Temp:  [98.2 F (36.8 C)-98.7 F (37.1 C)] 98.7 F (37.1 C) (05/18 1107) Pulse Rate:  [51-81] 73 (05/18 1107) Resp:  [14-20] 14 (05/18 1107) BP: (109-141)/(60-79) 131/79 (05/18 1107) SpO2:  [97 %-100 %] 100 % (05/18 1107) Physical Exam: General: Alert, pleasantly demented older man.  Was able to get out of bed and moved to bedside commode with nursing assistance.  NAD. Neuro: Alert and oriented x3 Cardiovascular: RRR Respiratory: Normal WOB on RA Abdomen: Soft, nondistended. BS present. Skin: warm, well perfused.   Laboratory: Most recent CBC Lab Results  Component Value Date   WBC 5.4 02/19/2023   HGB 16.3 02/19/2023   HCT 48.0 02/19/2023   MCV 84.4 02/19/2023   PLT 395 02/19/2023   Most recent BMP    Latest Ref Rng & Units 02/22/2023    4:38 AM  BMP  Glucose 70 - 99 mg/dL 91   BUN 8 - 23 mg/dL 14   Creatinine 1.30 - 1.24 mg/dL 8.65    Sodium 784 - 696 mmol/L 136   Potassium 3.5 - 5.1 mmol/L 3.8   Chloride 98 - 111 mmol/L 103   CO2 22 - 32 mmol/L 24   Calcium 8.9 - 10.3 mg/dL 9.0     Lincoln Brigham, MD 02/24/2023, 11:26 AM  PGY-1, [place] Family Medicine FPTS Intern pager: [phone], text pages welcome Secure chat group [facility] Teaching Service

## 2023-02-24 NOTE — Plan of Care (Signed)

## 2023-02-24 NOTE — Progress Notes (Signed)
     Daily Progress Note Intern Pager: [phone]  Patient name: Cameron Barton Medical record number: [id number] Date of birth: [birth year] Age: 89 y.o. Gender: male  Primary Care Provider: System, Provider Not In Consultants: Neuro Code Status: DNR  Pt Overview and Major Events to Date:  5/13: Admitted 5/15: CODE STATUS changed to DNR/DNI  Assessment and Plan: Alisa Appleyard is a 89 year old male admitted for ischemic stroke of right basal ganglia with right temporal lobe.  He is medically stable for discharge.  pertinent PMH/PSH includes knee repair, dementia.   * Left hemiparesis (HCC) Medically stable and awaiting SNF placement.  Neurology signed off - Continue aspirin 81mg  daily - Continue Plavix 75mg  daily for 3 weeks (last dose 6/4) - Cont rosuvastatin 20mg  daily  - Consider outpt palliative consult - Fall precaution  Dementia (HCC) Stable.  ANO x 3. - Delirium precaution    FEN/GI: Regular diet PPx: Lovenox Dispo:SNF  awaiting placement . Barriers include SNF authorization.   Subjective:  Patient said he has no concern. He is doing well and able to express he is currently in the hospital.  Objective: Temp:  [98 F (36.7 C)-98.9 F (37.2 C)] 98 F (36.7 C) (05/18 1947) Pulse Rate:  [51-81] 72 (05/18 1947) Resp:  [14-20] 16 (05/18 1947) BP: (109-141)/(60-79) 122/76 (05/18 1947) SpO2:  [96 %-100 %] 96 % (05/18 1947) Physical Exam: General: Awake, appropriate for age NAD CV: RRR, no murmurs, normal S1/S2 Pulm: CTAB, good WOB on RA, no crackles or wheezing Abd: Soft, no distension, no tenderness Ext: No BLE edema Neuro: A&O x3, good strength on all Extremity with RE > LE   Laboratory: Most recent CBC Lab Results  Component Value Date   WBC 5.4 02/19/2023   HGB 16.3 02/19/2023   HCT 48.0 02/19/2023   MCV 84.4 02/19/2023   PLT 395 02/19/2023   Most recent BMP    Latest Ref Rng & Units 02/22/2023    4:38 AM  BMP  Glucose 70 - 99 mg/dL 91   BUN 8 -  23 mg/dL 14   Creatinine 1.47 - 1.24 mg/dL 8.29   Sodium 562 - 130 mmol/L 136   Potassium 3.5 - 5.1 mmol/L 3.8   Chloride 98 - 111 mmol/L 103   CO2 22 - 32 mmol/L 24   Calcium 8.9 - 10.3 mg/dL 9.0     Imaging/Diagnostic Tests: No new images  Jerre Simon, MD 02/24/2023, 10:06 PM  PGY-2, [place] Family Medicine FPTS Intern pager: [phone], text pages welcome Secure chat group [facility] Teaching Service

## 2023-02-25 DIAGNOSIS — I639 Cerebral infarction, unspecified: Secondary | ICD-10-CM | POA: Diagnosis not present

## 2023-02-25 NOTE — Progress Notes (Signed)
SW met with pt's daughter Donalynn Furlong and granddtr Josefina Do at bedside. Dtr reports she is the primary HCPOA and that pt's sister Jacqulyn Cane has HCPOA paperwork that is no longer valid. Pastoral Care consult is pending to assist with HCPOA.    Dtr reports she and grandddtr live in [place] and were only recently notified that pt was in the hospital. Discussed SNF recommendation and dtr is agreeable but does not want to accept [facility] which is the choice made by pt's sister.   Current SNF offers provided to pt's dtr who plans to review options tonight and tour tomorrow. Dtr requests staff reach out to granddtr if she is not available by phone as they are usually together. Contact information updated in record.   SW will provide updates as available.   Dellie Burns, MSW, LCSW [phone] (coverage)

## 2023-02-25 NOTE — Progress Notes (Signed)
Call placed to [employer] this morning re auth status update and was informed no clinicals have been received. Clinicals have now been submitted, ref # [id number]. SW will provide updates as available.   Cameron Barton, MSW, LCSW [phone] (coverage)

## 2023-02-25 NOTE — Plan of Care (Signed)
  Problem: Education: Goal: Knowledge of disease or condition will improve Outcome: Progressing   Problem: Coping: Goal: Will verbalize positive feelings about self Outcome: Progressing Goal: Will identify appropriate support needs Outcome: Progressing   Problem: Self-Care: Goal: Ability to communicate needs accurately will improve Outcome: Progressing   Problem: Nutrition: Goal: Risk of aspiration will decrease Outcome: Progressing Goal: Dietary intake will improve Outcome: Progressing

## 2023-02-25 NOTE — Progress Notes (Signed)
In performing NIH assessment, pt was unable to read some of the words-pt states he never went to school and doesn't know how to read some words. Pt was able to repeat words after being told words for assessment purposes.

## 2023-02-25 NOTE — Plan of Care (Signed)
  Problem: Education: Goal: Knowledge of disease or condition will improve Outcome: Progressing   Problem: Ischemic Stroke/TIA Tissue Perfusion: Goal: Complications of ischemic stroke/TIA will be minimized Outcome: Progressing   Problem: Self-Care: Goal: Ability to participate in self-care as condition permits will improve Outcome: Progressing   Problem: Self-Care: Goal: Ability to communicate needs accurately will improve Outcome: Progressing   Problem: Nutrition: Goal: Dietary intake will improve Outcome: Progressing   Problem: Education: Goal: Knowledge of General Education information will improve Description: Including pain rating scale, medication(s)/side effects and non-pharmacologic comfort measures Outcome: Progressing   Problem: Coping: Goal: Level of anxiety will decrease Outcome: Progressing   Problem: Skin Integrity: Goal: Risk for impaired skin integrity will decrease Outcome: Progressing   Problem: Safety: Goal: Ability to remain free from injury will improve Outcome: Progressing

## 2023-02-26 DIAGNOSIS — I63511 Cerebral infarction due to unspecified occlusion or stenosis of right middle cerebral artery: Secondary | ICD-10-CM

## 2023-02-26 MED ORDER — ORAL CARE MOUTH RINSE: 15.0000 mL | OROMUCOSAL | Status: DC | PRN

## 2023-02-26 NOTE — Discharge Summary (Signed)
Family Medicine Teaching [facility] Discharge Summary  Patient name: Cameron Barton Medical record number: [id number] Date of birth: [birth year] Age: 89 y.o. Gender: male Date of Admission: 02/19/2023  Date of Discharge: 02/26/23 Admitting Physician: Lincoln Brigham, MD  Primary Care Provider: System, Provider Not In Consultants: Neurology  Indication for Hospitalization: Ischemic Stroke  Brief Hospital Course:  Cameron Barton is a 89 y.o.male with a history of CVA in past who was admitted to the [facility] Teaching Service at [facility] for Acute Ischemic Stroke. His hospital course is detailed below:  Acute Ischemic Stroke  Pt presented to ED after falling off bed due to L sided weakness. Initial CT head showed no acute abnormality. MRI showed acute infarct in right basal ganglia and right temporal lobe, and 1.3cm probable meningioma overlying the parasagittal anterior right frontal convexity. MRA head showed moderate stenosis of right M1 segment. Neurology consulted. Started DAPT with ASA and Plavix for 3 weeks, then transition to ASA monotherapy indefinitely (last dose Plavix 03/13/2023). Pt started on Crestor for secondary prevention. PT evaluation recommended SNF placement. Pt was discharge to SNF and has outpt followup with Stroke clinic 4 weeks after hospitalization.   Goals of Care Pt was initially Full code on admission. Discussed goals of care with HPOA [facility]) and decision was made to transition pt to DNR/DNI.  Intermittent Delirium  Dementia  Chronically demented at baseline and had some episodes of delirium during admission. Pt was redirectable and did not require prn medications for delirium during hospitalization.  Other chronic conditions were medically managed with home medications and formulary alternatives as necessary.  PCP Follow-up Recommendations:  Please send Cardiology referral for outpatient 30-day cardiac monitoring to workup potential Afib as cause of stroke.   Ensure Neurology f/ 4 weeks after hospitalization Consider outpatient GOC discussion and/or Palliative consult given acute progression of neurologic decline and new DNR/DNI status. Please ensure that pt stops Plavix after 6/4. He will continue ASA indefinitely.   Discharge Diagnoses/Problem List:  Acute ischemic stroke Dementia  Disposition: SNF  Discharge Condition: Improved and stable   Discharge Exam:  Gen: Demented, alert elderly man laying comfortably in bed.  NAD. Neuro: A & Ox3 HEENT: NCAT. MMM. L facial droop improving from prior exam Resp: Normal WOB on RA. CV: RRR Skin: Warm, well perfused  Significant Procedures: none  Significant Labs and Imaging:  No results for input(s): "WBC", "HGB", "HCT", "PLT" in the last 48 hours. No results for input(s): "NA", "K", "CL", "CO2", "GLUCOSE", "BUN", "CREATININE", "CALCIUM", "MG", "PHOS", "ALKPHOS", "AST", "ALT", "ALBUMIN", "PROTEIN" in the last 48 hours.  none  Results/Tests Pending at Time of Discharge: none  Discharge Medications:  Allergies as of 02/26/2023   No Known Allergies      Medication List     TAKE these medications    acetaminophen 500 MG tablet Commonly known as: TYLENOL Take 500 mg by mouth every 6 (six) hours as needed for moderate pain.   aspirin EC 81 MG tablet Take 1 tablet (81 mg total) by mouth daily. Swallow whole.   clopidogrel 75 MG tablet Commonly known as: PLAVIX Take 1 tablet (75 mg total) by mouth daily for 17 days.   rosuvastatin 20 MG tablet Commonly known as: CRESTOR Take 1 tablet (20 mg total) by mouth daily.        Discharge Instructions: Please refer to Patient Instructions section of EMR for full details.  Patient was counseled important signs and symptoms that should prompt return to medical  care, changes in medications, dietary instructions, activity restrictions, and follow up appointments.   Follow-Up Appointments:  Follow-up Information     [facility]. Schedule an appointment as soon as possible for a visit in 1 month(s).   Specialty: Neurology Why: stroke clinic Contact information: [address] [place] [address] [phone]                Lincoln Brigham, MD 02/26/2023, 12:50 PM PGY-1, [facility] Health Family Medicine

## 2023-02-26 NOTE — TOC Transition Note (Signed)
Transition of Care [facility]) - CM/SW Discharge Note   Patient Details  Name: Cameron Barton MRN: [id number] Date of Birth: August 21, 1929  Transition of Care [facility]) CM/SW Contact:  Baldemar Lenis, LCSW Phone Number: 02/26/2023, 1:35 PM   Clinical Narrative:   CSW contacted [facility] Supervisor to discuss issue with POA, confirmed to contact daughter to discuss SNF placement. CSW spoke with daughter, Cameron Barton, who provided POA that was updated November 2023. POA paperwork has been placed in patient's chart. 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 CSW contacted [facility] and confirmed bed availability. CSW requested initiation of insurance authorization and patient was approved. [facility] can accept today. CSW updated daughter, she is in agreement. Transport arranged with [facility] for next available.  Nurse to call report to [phone], Room 301.    Final next level of care: Skilled Nursing Facility Barriers to Discharge: Barriers Resolved   Patient Goals and CMS Choice      Discharge Placement                Patient chooses bed at: [facility] Patient to be transferred to facility by: [facility] Name of family member notified: Cameron Barton Patient and family notified of of transfer: 02/26/23  Discharge Plan and Services Additional resources added to the After Visit Summary for                                       Social Determinants of Health (SDOH) Interventions SDOH Screenings   Food Insecurity: No Food Insecurity (02/20/2023)  Housing: Low Risk  (02/20/2023)  Transportation Needs: No Transportation Needs (02/20/2023)  Utilities: Not At Risk (02/20/2023)  Tobacco Use: Medium Risk (02/19/2023)     Readmission Risk Interventions     No data to display

## 2023-02-26 NOTE — Progress Notes (Signed)
   02/26/23 1400  Spiritual Encounters  Type of Visit Initial  Care provided to: Patient  Referral source Patient request;Family  Reason for visit Advance directives  OnCall Visit No   Ch responded to request to correct POA in chart. Pt's sister and friend were at bedside. The daughter of the pt Bonita Quin was concerned that Gillian Shields (pt's sister) was listed at the [facility]. Ch consulted with charge nurse and attending nurse. Pt's daughter Bonita Quin is the person listed at the [facility]. Ch explained to pt's sister Jacqulyn Cane) that if she has a current POA, she needs to bring it so we can update the chart. Pt is being transferred to skill nursing facility. No follow-up needed at this time.

## 2023-02-26 NOTE — Plan of Care (Signed)

## 2023-02-26 NOTE — Progress Notes (Signed)
Physical Therapy Treatment Patient Details Name: Cameron Barton MRN: [id number] DOB: 06/19/1929 Today's Date: 02/26/2023   History of Present Illness 89 y.o. male admitted 02/19/2023 with L side weakness MRI (+) acute ischemic infarcts R basal ganglia and R temporal lobe, probable meningioma overlying the parasagittal anterior R frontal convexity PMH HA falls    PT Comments    Pt pleasant and agreeable to participate in physical therapy session. Able to perform bed level exercises for LLE strengthening and progressive ambulation. Pt requiring moderate assist for transfers and ambulating 36 ft x 2 with a walker and close chair follow. Demonstrates decreased L sided coordination, gait abnormalities, weakness, impaired standing balance and decreased activity tolerance. Will benefit from continued inpatient follow up therapy, <3 hours/day in order to address deficits, maximize functional mobility and decrease caregiver burden.    Recommendations for follow up therapy are one component of a multi-disciplinary discharge planning process, led by the attending physician.  Recommendations may be updated based on patient status, additional functional criteria and insurance authorization.  Follow Up Recommendations  Can patient physically be transported by private vehicle: No    Assistance Recommended at Discharge Frequent or constant Supervision/Assistance  Patient can return home with the following A lot of help with walking and/or transfers;A lot of help with bathing/dressing/bathroom;Assistance with cooking/housework;Assistance with feeding;Direct supervision/assist for medications management;Direct supervision/assist for financial management;Assist for transportation;Help with stairs or ramp for entrance   Equipment Recommendations  Wheelchair (measurements PT);Hospital bed;BSC/3in1;Rolling walker (2 wheels)    Recommendations for Other Services       Precautions / Restrictions  Precautions Precautions: Fall Restrictions Weight Bearing Restrictions: No     Mobility  Bed Mobility Overal bed mobility: Needs Assistance Bed Mobility: Supine to Sit     Supine to sit: Supervision          Transfers Overall transfer level: Needs assistance Equipment used: Rolling walker (2 wheels) Transfers: Sit to/from Stand Sit to Stand: Mod assist           General transfer comment: ModA to power up from edge of bed and chair. Cues for hand placement    Ambulation/Gait Ambulation/Gait assistance: Min assist Gait Distance (Feet): 36 Feet (36", 36") Assistive device: Rolling walker (2 wheels) Gait Pattern/deviations: Step-to pattern, Step-through pattern, Decreased step length - left, Narrow base of support, Ataxic, Trunk flexed, Decreased stride length, Decreased dorsiflexion - left, Scissoring Gait velocity: decreased     General Gait Details: Verbal cues for upright posture, walker management, larger L step length, wider BOS. Chair follow utilized. Pt requiring one seated rest break   Stairs             Wheelchair Mobility    Modified Rankin (Stroke Patients Only) Modified Rankin (Stroke Patients Only) Pre-Morbid Rankin Score: Moderate disability Modified Rankin: Moderately severe disability     Balance Overall balance assessment: Needs assistance Sitting-balance support: No upper extremity supported, Feet supported Sitting balance-Leahy Scale: Fair     Standing balance support: Bilateral upper extremity supported, Reliant on assistive device for balance, During functional activity Standing balance-Leahy Scale: Poor                              Cognition Arousal/Alertness: Awake/alert Behavior During Therapy: Impulsive Overall Cognitive Status: History of cognitive impairments - at baseline Area of Impairment: Attention, Memory, Following commands, Safety/judgement, Awareness, Problem solving  Current Attention Level: Sustained Memory: Decreased recall of precautions, Decreased short-term memory Following Commands: Follows one step commands consistently, Follows multi-step commands inconsistently, Follows one step commands with increased time Safety/Judgement: Decreased awareness of safety, Decreased awareness of deficits Awareness: Intellectual Problem Solving: Difficulty sequencing, Requires verbal cues, Slow processing          Exercises General Exercises - Lower Extremity Heel Slides: Left, 10 reps, Supine Hip ABduction/ADduction: Left, 10 reps, Supine Straight Leg Raises: Left, 10 reps, Supine    General Comments        Pertinent Vitals/Pain Pain Assessment Pain Assessment: No/denies pain    Home Living                          Prior Function            PT Goals (current goals can now be found in the care plan section) Acute Rehab PT Goals Potential to Achieve Goals: Fair Progress towards PT goals: Progressing toward goals    Frequency    Min 3X/week      PT Plan Current plan remains appropriate    Co-evaluation              AM-PAC PT "6 Clicks" Mobility   Outcome Measure  Help needed turning from your back to your side while in a flat bed without using bedrails?: A Little Help needed moving from lying on your back to sitting on the side of a flat bed without using bedrails?: A Little Help needed moving to and from a bed to a chair (including a wheelchair)?: A Little Help needed standing up from a chair using your arms (e.g., wheelchair or bedside chair)?: A Lot Help needed to walk in hospital room?: A Little Help needed climbing 3-5 steps with a railing? : Total 6 Click Score: 15    End of Session Equipment Utilized During Treatment: Gait belt Activity Tolerance: Patient tolerated treatment well Patient left: with call bell/phone within reach;in chair;with chair alarm set Nurse Communication: Mobility status PT Visit  Diagnosis: Other abnormalities of gait and mobility (R26.89);Muscle weakness (generalized) (M62.81);Other symptoms and signs involving the nervous system (R29.898);Hemiplegia and hemiparesis;Unsteadiness on feet (R26.81);Difficulty in walking, not elsewhere classified (R26.2) Hemiplegia - Right/Left: Left Hemiplegia - dominant/non-dominant: Non-dominant Hemiplegia - caused by: Cerebral infarction     Time: 1044-1101 PT Time Calculation (min) (ACUTE ONLY): 17 min  Charges:  $Gait Training: 8-22 mins                     Lillia Pauls, PT, DPT Acute Rehabilitation Services Office [phone]    Cameron Barton 02/26/2023, 11:58 AM

## 2023-04-05 NOTE — Progress Notes (Deleted)
[facility] [address] [place]. [place] [address] [phone]       HOSPITAL FOLLOW UP NOTE  [name] Date of Birth:  10-May-1929 Medical Record Number:  [id number]   Reason for Referral:  hospital stroke follow up    SUBJECTIVE:   CHIEF COMPLAINT:  No chief complaint on file.   HPI:   Cameron Barton is a 89 y.o. who  has a past medical history of Arthritis.  Patient presented on 02/19/2023 with fluctuating left sided weakness. Left sided facial droop noted in ER. CT unremarkable. CTA head and neck showed suboptimal contrast bolus and within this limitation does not show large vessel occlusion or significant stenosis in the head or neck. MRI showed patchy acute ischemic infarcts in right BG, caudate, CR and right temporal lobe. 1.3cm probably meningioma over parasagittal anterior right frontal convexity. MRA no LVO. 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 Asa 81mg  continued and Plavix added for 3 months. Loading dose of 300mg  given in ER. Personally reviewed hospitalization pertinent progress notes, lab work and imaging.  Evaluated by Dr Cameron Barton.   He was discharged to [facility] 02/26/2023.   He had chronic left sided weakness per sister. Severe dementia. Totally dependant.   PERTINENT IMAGING/LABS  Code Stroke CT head No acute abnormality. Small vessel disease. Atrophy. ASPECTS 10.    CTA head & neck poor timing of bolus and no LVO MRI patchy acute ischemic infarcts in right BG, caudate, CR and right temporal lobe, 1.3 cm probable meningioma over parasagittal anterior right frontal convexity MRA no LVO, up to moderate stenosis of mid distal right M1 [place] BLE no DVT 2D Echo pending Consider 30-day monitoring after discharge   A1C Lab Results  Component Value Date   HGBA1C 4.9 02/19/2023    Lipid Panel     Component Value Date/Time   CHOL 151 02/20/2023 0153   TRIG 35 02/20/2023 0153   HDL 66 02/20/2023 0153   CHOLHDL 2.3 02/20/2023 0153    VLDL 7 02/20/2023 0153   LDLCALC 78 02/20/2023 0153      ROS:   14 system review of systems performed and negative with exception of those listed in HPI  PMH:  Past Medical History:  Diagnosis Date   Arthritis     PSH:  Past Surgical History:  Procedure Laterality Date   HERNIA REPAIR     INGUINAL HERNIA REPAIR Left 10/02/2021   Procedure: HERNIA REPAIR INGUINAL INCARCERATED;  Surgeon: Romie Levee, MD;  Location: [facility];  Service: General;  Laterality: Left;    Social History:  Social History   Socioeconomic History   Marital status: Divorced    Spouse name: Not on file   Number of children: Not on file   Years of education: Not on file   Highest education level: Not on file  Occupational History   Not on file  Tobacco Use   Smoking status: Former   Smokeless tobacco: Never  Substance and Sexual Activity   Alcohol use: No    Alcohol/week: 0.0 standard drinks of alcohol   Drug use: No   Sexual activity: Not on file  Other Topics Concern   Not on file  Social History Narrative   Not on file   Social Determinants of Health   Financial Resource Strain: Not on file  Food Insecurity: No Food Insecurity (02/20/2023)   Hunger Vital Sign    Worried About Running Out of Food in the Last Year: Never true  Ran Out of Food in the Last Year: Never true  Transportation Needs: No Transportation Needs (02/20/2023)   PRAPARE - Administrator, Civil Service (Medical): No    Lack of Transportation (Non-Medical): No  Physical Activity: Not on file  Stress: Not on file  Social Connections: Not on file  Intimate Partner Violence: Patient Unable To Answer (02/25/2023)   Humiliation, Afraid, Rape, and Kick questionnaire    Fear of Current or Ex-Partner: Patient unable to answer    Emotionally Abused: Patient unable to answer    Physically Abused: Patient unable to answer    Sexually Abused: Patient unable to answer    Family History: No family history on  file.  Medications:   Current Outpatient Medications on File Prior to Visit  Medication Sig Dispense Refill   acetaminophen (TYLENOL) 500 MG tablet Take 500 mg by mouth every 6 (six) hours as needed for moderate pain.     aspirin EC 81 MG tablet Take 1 tablet (81 mg total) by mouth daily. Swallow whole. 30 tablet 0   rosuvastatin (CRESTOR) 20 MG tablet Take 1 tablet (20 mg total) by mouth daily. 30 tablet 0   No current facility-administered medications on file prior to visit.    Allergies:  No Known Allergies    OBJECTIVE:  Physical Exam  There were no vitals filed for this visit. There is no height or weight on file to calculate BMI. No results found.     08/01/2015   10:45 AM  Depression screen PHQ 2/9  Decreased Interest 0  Down, Depressed, Hopeless 0  PHQ - 2 Score 0     General: well developed, well nourished, seated, in no evident distress Head: head normocephalic and atraumatic.   Neck: supple with no carotid or supraclavicular bruits Cardiovascular: regular rate and rhythm, no murmurs Musculoskeletal: no deformity Skin:  no rash/petichiae Vascular:  Normal pulses all extremities   Neurologic Exam Mental Status: Awake and fully alert.  Fluent speech and language.  Oriented to place and time. Recent and remote memory intact. Attention span, concentration and fund of knowledge appropriate. Mood and affect appropriate.  Cranial Nerves: Fundoscopic exam reveals sharp disc margins. Pupils equal, briskly reactive to light. Extraocular movements full without nystagmus. Visual fields full to confrontation. Hearing intact. Facial sensation intact. Face, tongue, palate moves normally and symmetrically.  Motor: Normal bulk and tone. Normal strength in all tested extremity muscles Sensory.: intact to touch , pinprick , position and vibratory sensation.  Coordination: Rapid alternating movements normal in all extremities. Finger-to-nose and heel-to-shin performed accurately  bilaterally. Gait and Station: Arises from chair without difficulty. Stance is normal. Gait demonstrates normal stride length and balance with ***. Tandem walk and heel toe ***.  Reflexes: 1+ and symmetric.    NIHSS  *** Modified Rankin  ***    ASSESSMENT: Cameron Barton is a 89 y.o. year old male ***. Vascular risk factors include ***.      PLAN:  Stroke: Acute right MCA scattered infarcts, likely due to right MCA stenosis vs. Cardioembolic source: Residual deficit: chronic left sided weakness. Continue aspirin 81 mg daily and rosuvastatin 20mg  daily for secondary stroke prevention.  Discussed secondary stroke prevention measures and importance of close PCP follow up for aggressive stroke risk factor management. I have gone over the pathophysiology of stroke, warning signs and symptoms, risk factors and their management in some detail with instructions to go to the closest emergency room for symptoms of concern. HTN: BP  goal <130/90.  Stable on no HTN agents. Continue to monitor per PCP HLD: LDL goal <70. Recent LDL 78. Continue rosuvastatin 20mg  daily per PCP.  DMII: A1c goal<7.0. Recent A1c 4.9. Not diabetic. Continue healthy lifestyle habits per PCP.  Dementia: totally dependent.    Follow up in *** or call earlier if needed   CC:  [facility] provider: Dr. Pearlean Brownie PCP: System, Provider Not In    I spent *** minutes of face-to-face and non-face-to-face time with patient.  This included previsit chart review including review of recent hospitalization, lab review, study review, order entry, electronic health record documentation, patient education regarding recent stroke including etiology, secondary stroke prevention measures and importance of managing stroke risk factors, residual deficits and typical recovery time and answered all other questions to patient satisfaction   Shawnie Dapper, [facility]  [facility] [address] [place], [place] [address]  Phone  [phone] Fax [phone] Note: This document was prepared with digital dictation and possible smart phrase technology. Any transcriptional errors that result from this process are unintentional.

## 2023-04-16 ENCOUNTER — Inpatient Hospital Stay: Payer: Commercial Managed Care - HMO | Admitting: Family Medicine

## 2023-04-16 DIAGNOSIS — I639 Cerebral infarction, unspecified: Secondary | ICD-10-CM
